# Patient Record
Sex: Male | Born: 1950 | Race: White | Hispanic: No | Marital: Married
Health system: Midwestern US, Community
[De-identification: ages and names within clinical notes are randomized; demographics above are authoritative.]

---

## 2012-01-25 IMAGING — DX CHEST PA AND LATERAL
1 series · 2 of 2 positions shown · non-contrast
Comparison: none

HISTORY: Congestion.

[Series 1: PA · U · 0.14mm/px · 2 of 2 slices shown]
[im 1/2]
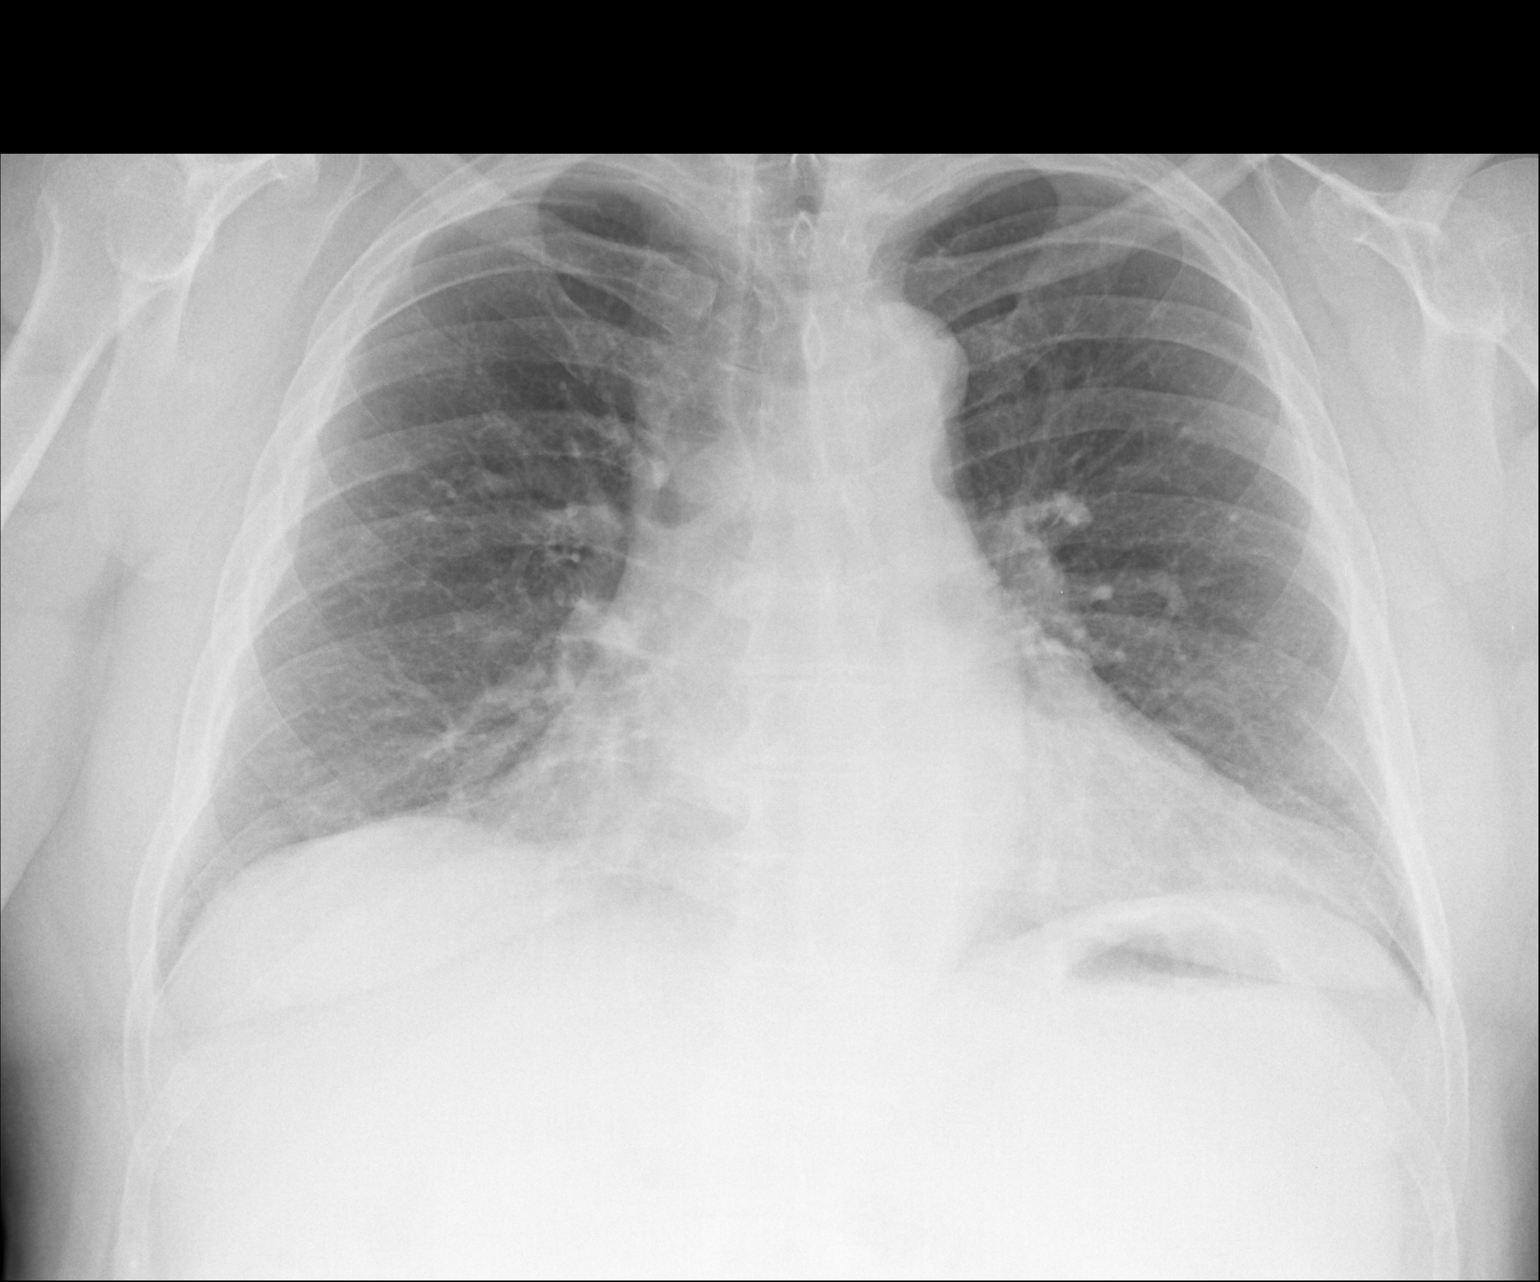
[im 2/2]
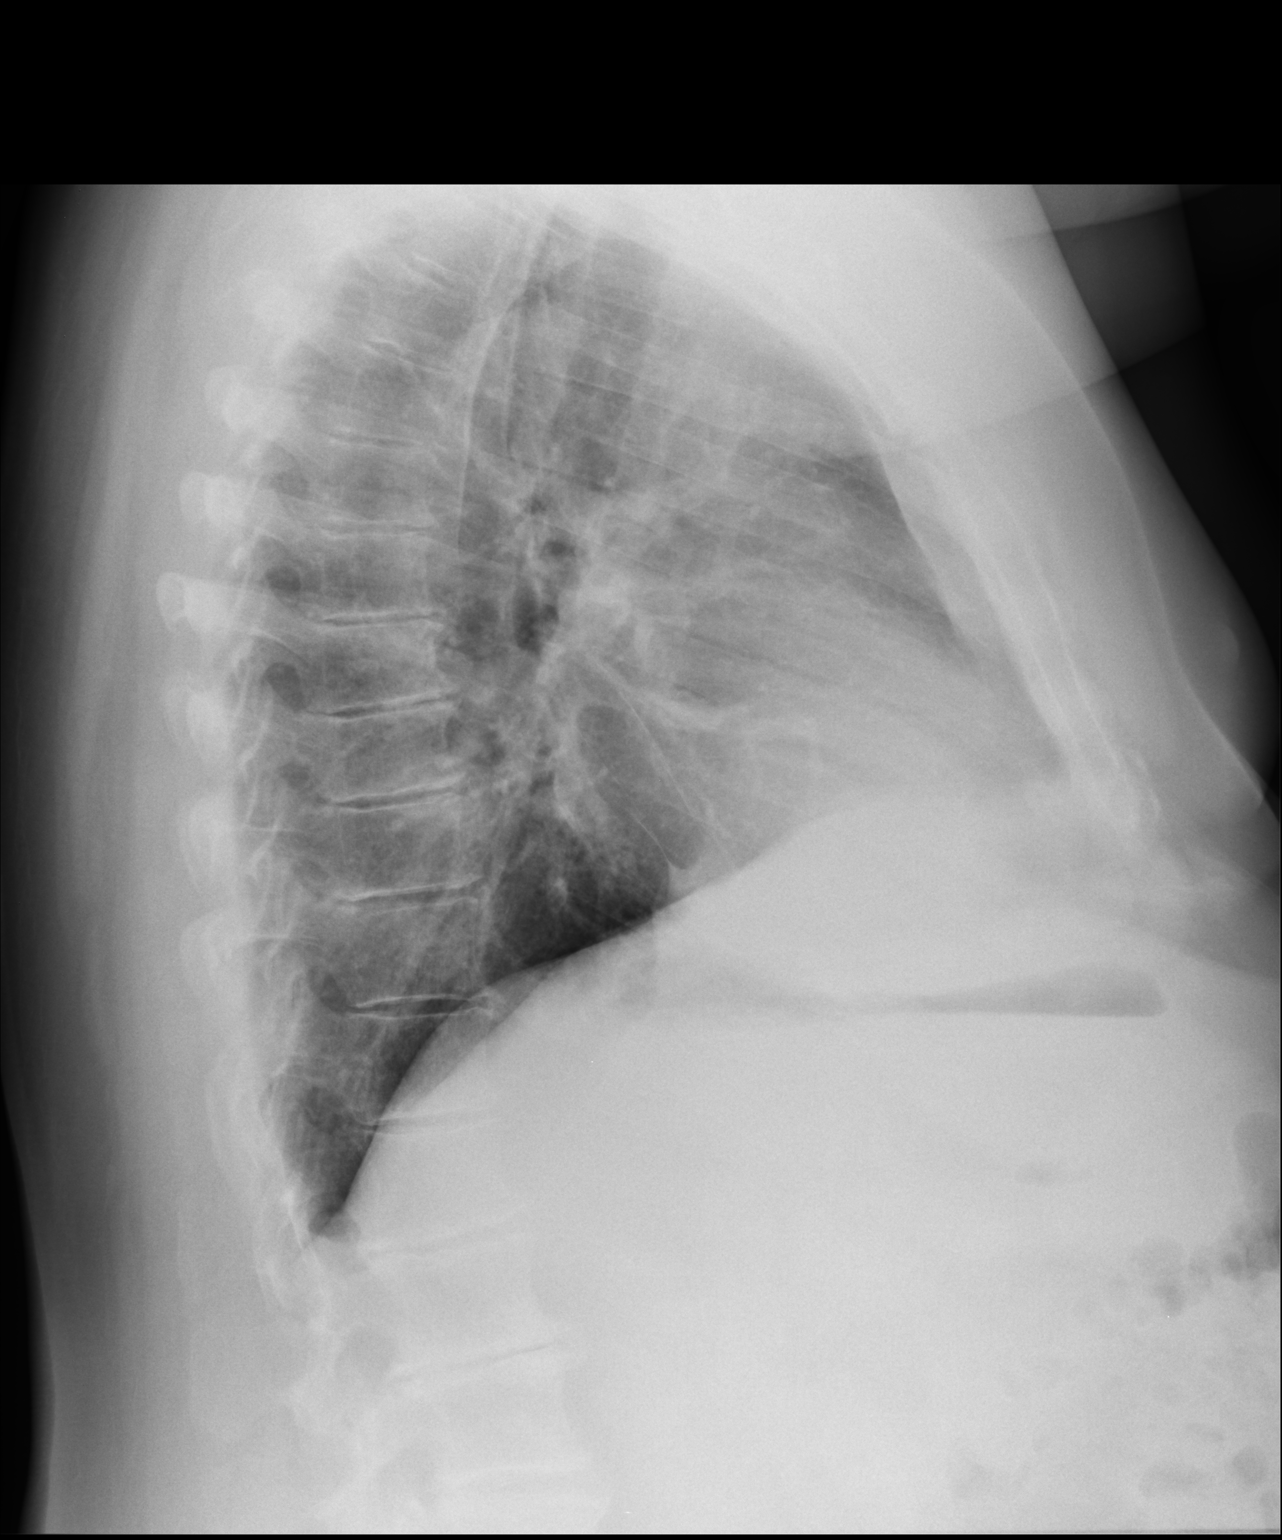

[2 of 2 positions shown; findings below may reference images not displayed]

FINDINGS: The heart is enlarged.  Lungs are moderately well expanded.
There is no evidence for airspace infiltrate or effusion.  There are
arteriosclerotic and degenerative changes.
IMPRESSION: Cardiomegaly.  No active cardiopulmonary process.

## 2014-09-02 IMAGING — DX UPPER GI WITH CONTRAST WITH KUB
1 series · 1 of 1 positions shown · IV contrast (agent unspecified)
Comparison: none

UPPER GI WITH CONTRAST WITH KUB, 09/02/2014 [DATE]:
An upper GI was performed on 09/02/2014 in this patient with morbid obesity
and
planned gastric bypass surgery who is 63 years old.
The scout film shows a normal bowel gas pattern. Degenerative change is seen
in
the spine. The patient drank the barium without difficulty. Esophageal mucosa
is
normal. There is slightly diminished motility with delayed emptying of the
esophagus and reflux from the distal esophagus proximally. The gastric and
proximal duodenal mucosa and motility appear normal.

[AP]
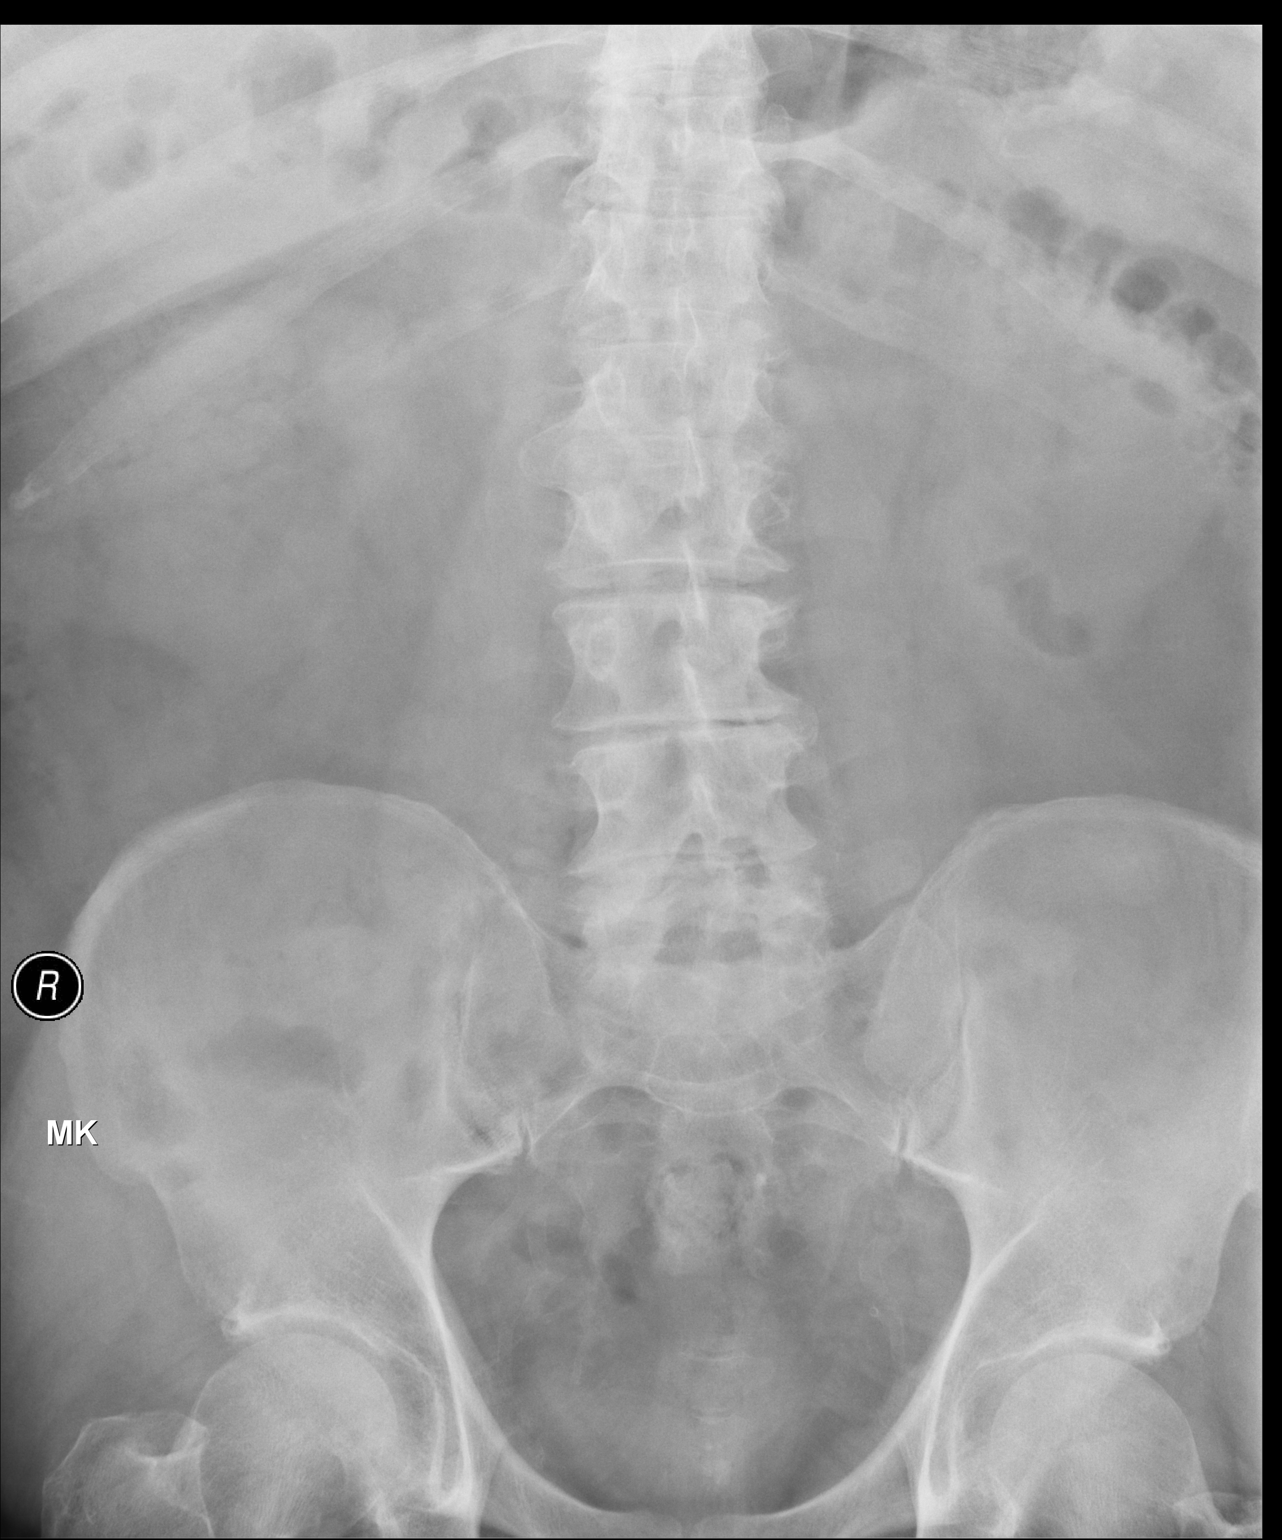

[1 of 1 positions shown; findings below may reference images not displayed]

IMPRESSION: Diminished esophageal motility. Otherwise negative upper GI.

## 2014-09-09 IMAGING — DX CHEST PA AND LATERAL
2 series · 2 of 2 positions shown · non-contrast
Comparison: 01/25/2012.

CLINICAL INDICATION:  Hypertension, preop for gastric bypass surgery

[PA]
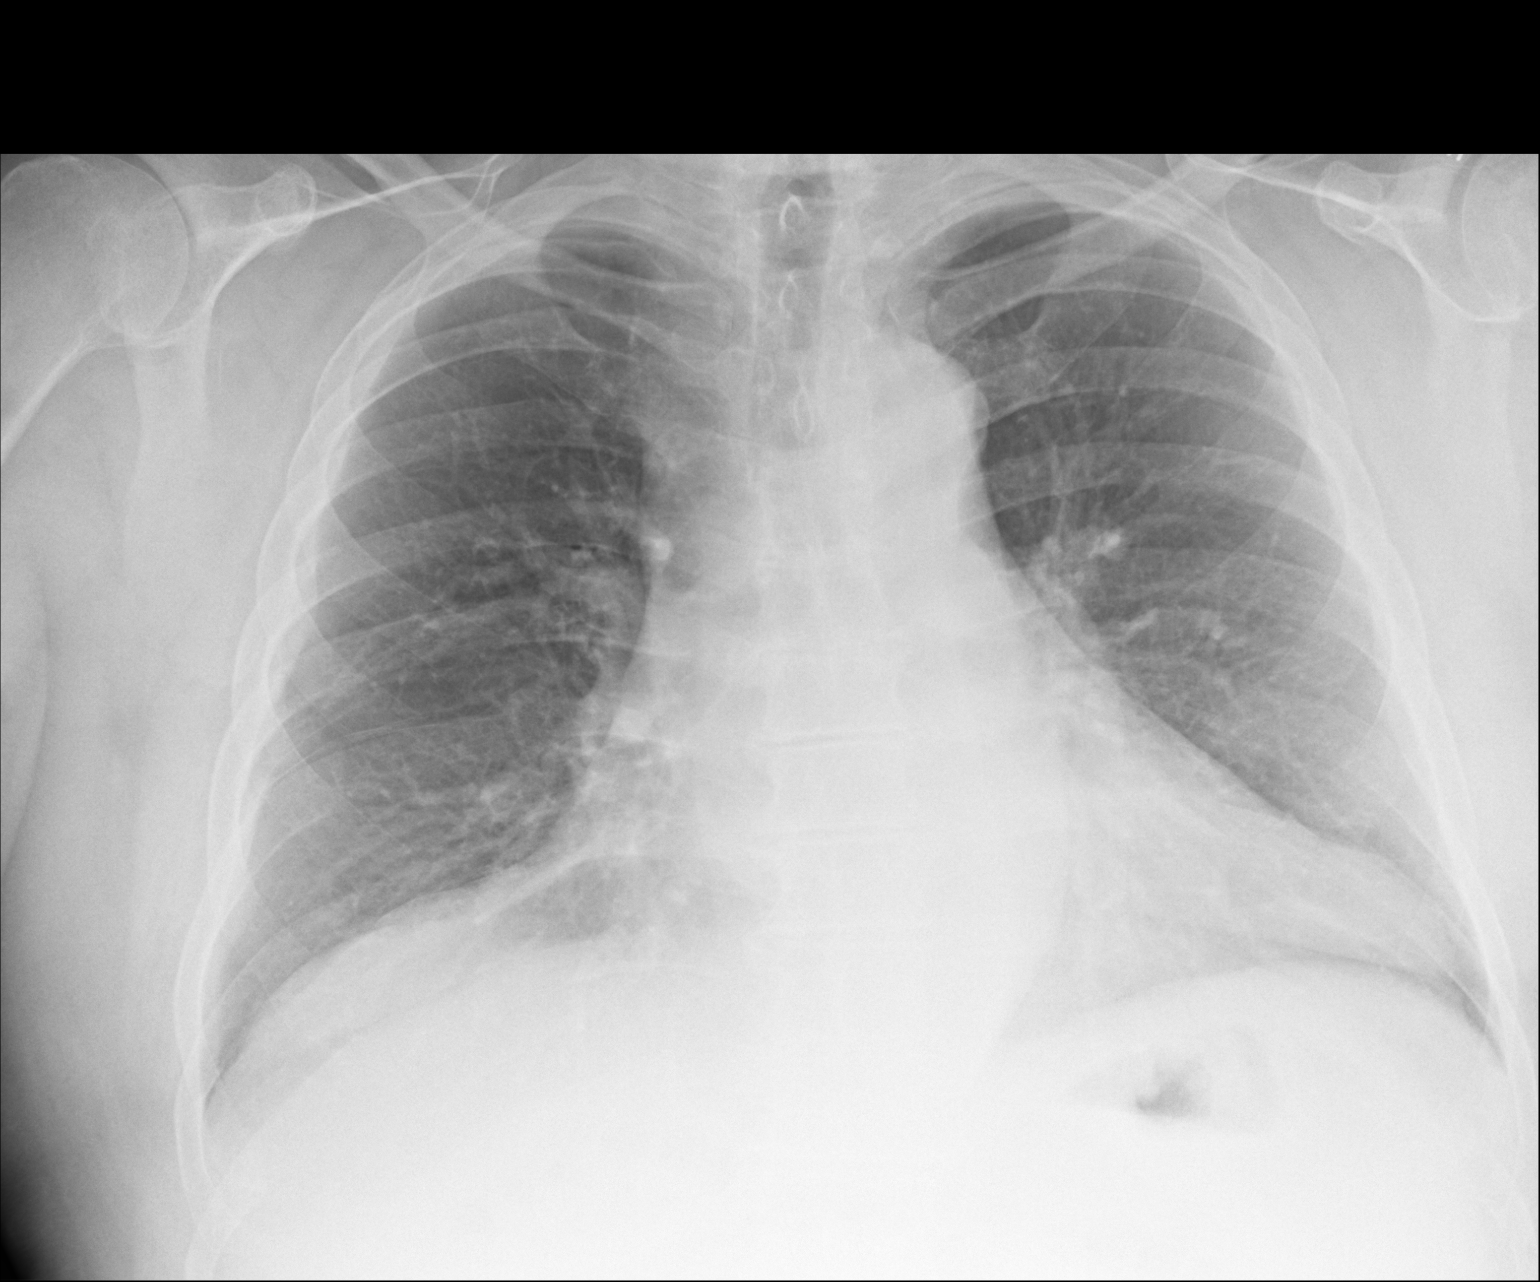

[lateral]
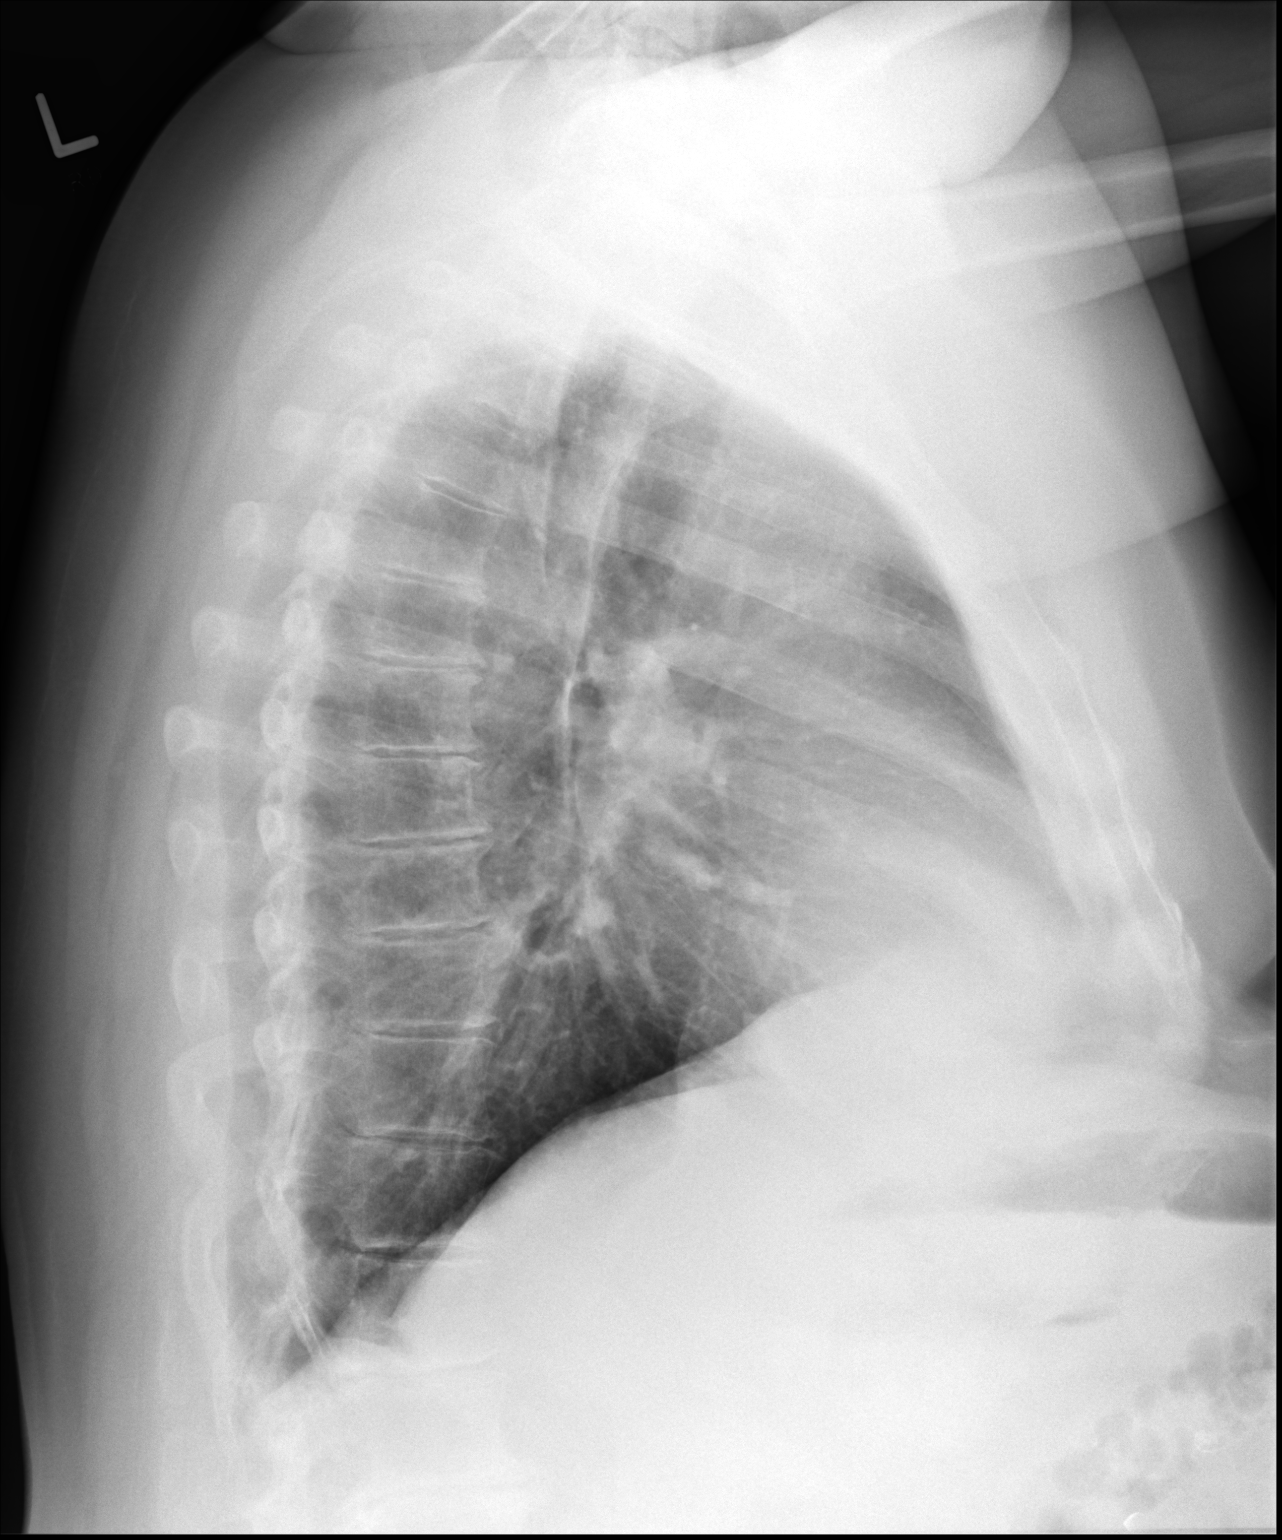

[2 of 2 positions shown; findings below may reference images not displayed]

FINDINGS: Atherosclerotic changes are seen within the thoracic aorta.
Degenerative changes are seen in the thoracic spine. No mass or consolidation.
No pleural effusions identified.
IMPRESSION: No radiographic evidence of acute cardiopulmonary disease.

## 2015-12-29 NOTE — Progress Notes (Signed)
[facility] [address] [place], [place] [address]    Acknowledgment of Informed Consent for Surgical or Medical Procedure and Sedation  I agree to allow doctor(s) STEVEN M UDELHOFEN and his/her associates or assistants, including residents and/or other qualified medical practitioner to perform the following medical treatment or procedure and to administer or direct the administration of sedation as necessary:  Procedure(s): LAPAROSCOPIC SLEEVE GASTRECTOMY WITH POSSIBLE HIATAL HERNIA REPAIR  My doctor has explained the following regarding the proposed procedure:  ??? the explanation of the procedure  ??? the benefits of the procedure  ??? the potential problems that might occur during recuperation  ??? the risks and side effects of the procedure which could include but are not limited to severe blood loss, infection, stroke or death  ??? the benefits, risks and side effect of alternative procedures including the consequences of declining this procedure or any alternative procedures  ??? the likelihood of achieving satisfactory results.  I acknowledge no guarantee or assurance has been made to me regarding the results.    I understand that during the course of this treatment/procedure, unforeseen conditions can occur which require an additional or different procedure.  I agree to allow my physician or assistants to perform such extension of the original procedure as they may find necessary.    I understand that sedation will often result in temporary impairment of memory and fine motor skills and that sedation can occasionally progress to a state of deep sedation or general anesthesia.    I understand the risks of anesthesia for surgery include, but are not limited to, sore throat, hoarseness, injury to face, mouth, or teeth; nausea; headache; injury to blood vessels or nerves; death, brain damage, or paralysis.    I understand that if I have a Limitation of Treatment order in effect during my  hospitalization, the order may or may not be in effect during this procedure.     I give my doctor permission to give me blood or blood products.  I understand that there are risks with receiving blood such as hepatitis, AIDS, fever, or allergic reaction.  I acknowledge that the risks, benefits, and alternatives of this treatment have been explained to me and that no express or implied warranty has been given by the hospital, any blood bank, or any person or entity as to the blood or blood components transfused.    At the discretion of my doctor, I agree to allow observers, equipment/product representatives and allow photographing, and/or televising of the procedure, provided my name or identity is maintained confidentially.      I agree the hospital may dispose of or use for scientific or educational purposes any tissue, fluid, or body parts which may be removed.    ________________________________Date________Time______ am/pm  (Circle One)  Patient or Signature of Closest Relative or Legal Guardian    ________________________________Date________Time______am/pm      Page 1 of ____  Witness

## 2016-01-12 ENCOUNTER — Inpatient Hospital Stay: Admit: 2016-01-12 | Attending: Surgery | Primary: Internal Medicine

## 2016-01-12 LAB — COMPREHENSIVE METABOLIC PANEL
ALT: 30 U/L (ref 10–40)
AST: 28 U/L (ref 15–37)
Albumin/Globulin Ratio: 1.1 (ref 1.1–2.2)
Albumin: 4.3 g/dL (ref 3.4–5.0)
Alkaline Phosphatase: 75 U/L (ref 40–129)
Anion Gap: 14 (ref 3–16)
BUN: 8 mg/dL (ref 7–20)
CO2: 25 mmol/L (ref 21–32)
Calcium: 9.2 mg/dL (ref 8.3–10.6)
Chloride: 98 mmol/L — ABNORMAL LOW (ref 99–110)
Creatinine: 0.6 mg/dL — ABNORMAL LOW (ref 0.8–1.3)
GFR African American: >60 (ref >60)
GFR Non-African American: >60 (ref >60)
Globulin: 3.8 g/dL
Glucose: 130 mg/dL — ABNORMAL HIGH (ref 70–99)
Potassium: 4.1 mmol/L (ref 3.5–5.1)
Sodium: 137 mmol/L (ref 136–145)
Total Bilirubin: 0.5 mg/dL (ref 0.0–1.0)
Total Protein: 8.1 g/dL (ref 6.4–8.2)

## 2016-01-12 LAB — CBC
Hematocrit: 46.9 % (ref 40.5–52.5)
Hemoglobin: 15.8 g/dL (ref 13.5–17.5)
MCH: 33.4 pg (ref 26.0–34.0)
MCHC: 33.7 g/dL (ref 31.0–36.0)
MCV: 99 fL (ref 80.0–100.0)
MPV: 8.6 fL (ref 5.0–10.5)
Platelets: 139 10*3/uL (ref 135–450)
RBC: 4.73 M/uL (ref 4.20–5.90)
RDW: 13.4 % (ref 12.4–15.4)
WBC: 6.3 10*3/uL (ref 4.0–11.0)

## 2016-01-12 LAB — TYPE AND SCREEN
ABO/Rh: A NEG
Antibody Screen: NEGATIVE

## 2016-01-12 NOTE — Patient Instructions (Signed)
[facility] PRE-SURGICAL TESTING INSTRUCTIONS                            Date of Procedure  5/15 Time of Procedure  0730    PRIOR TO PROCEDURE DATE:  1. Please follow any guidelines/instructions prior to your procedure as advised by your surgeon.    2. Arrange for someone to drive you home and be with you for the first 24 hours after discharge for your safety after your procedure for which you received sedation. Ensure it is someone we can share information with regarding your discharge.     3. You must contact your surgeon for instructions IF:  ??? You are taking any blood thinners, aspirin, anti-inflammatory or vitamin E.  ??? There is a change in your physical condition such as a cold, fever, rash, cuts, sores or any other infection, especially near your surgical site.    4. Do not drink alcohol the day before or day of your procedure.    5. A Pre-op History and Physical for surgery MUST be completed by your Physician or Urgent Care within 30 days of your procedure date.  Please bring a copy with you on the day of your procedure and along with any other testing performed.       THE DAY OF YOUR PROCEDURE:  1. Follow instructions for ARRIVAL TIME as DIRECTED BY YOUR SURGEON. If your surgeon does not give you a specific arrival time, please arrive at  0530.    2. Enter the MAIN entrance from [address] and follow the signs to the free [employer] or Valet parking (offered free of charge 6am-5pm).      3. Enter the Main Entrance of the hospital (do not enter from the lower level of the parking garage). Upon entrance, check in with the receptionist at the main desk on your left.  If no one is available at the desk, proceed into the Surgery Center Waiting Room and go through the door directly into the Surgery Center. There is a TEFL teacherCheck-in desk ACROSS from Room 5 (marked with a sign hanging from the ceiling). The phone number for the surgery center is [phone].    4. DO NOT EAT OR DRINK ANYTHING AFTER  MIDNIGHT.     5. Do not swallow water when brushing teeth. No gum, candy, mints or ice chips. Refrain from smoking or at least decrease the amount.    6. MEDICATIONS   ??? Take the following medications with a SMALL sip of water:  TAKE AMLODIPINE AND METOPROLOL   ??? Use your usual dose of inhalers the morning of surgery. BRING your rescue inhaler with you to hospital.   ??? Anesthesia does NOT want you to take insulin the morning of surgery. They will control your blood sugar while you are at the hospital. Please contact your ordering physician for instructions regarding your insulin the night before your procedure. If you have an insulin pump, please keep it set on basal rate.     7. Dress in loose, comfortable clothing appropriate for redressing after your procedure. Do not wear jewelry (including body piercings), make-up (especially NO eye make-up), fingernail polish (NO toenail polish if foot/leg surgery), lotion, powders or metal hairclips.    8. Dentures, glasses, or contacts will need to be removed before your procedure. Bring cases for your glasses, contacts, dentures, or hearing aids to protect them while you are in surgery.  9. If you use a CPAP, please bring it with you on the day of your procedure.    10. We recommend that valuable personal  belongings, such as credit cards, cash, cell phones, e-tablets or jewelry, be left at home during your stay. The hospital will not be responsible for valuables that are not secured in the hospital safe. However, if your insurance requires a co-pay, you may want to bring a method of payment, i.e. check, cash, or credit card, if you wish to pay your co-pay the day of surgery.      11. If you are to stay overnight, you may bring a bag with personal items. Please have any large items you may need brought in by your family after your arrival to your hospital room.    12. If you have a Living Will or Durable Power of Attorney, please bring a copy on the day of your  procedure.     68.  With your permission, one family member may accompany you while you are being prepared for surgery. Once you are ready, additional family members may join you.    HOW WE KEEP YOU SAFE and WORK TO PREVENT SURGICAL SITE INFECTIONS:  1. Health care workers should always check your ID bracelet to verify your name and birth date. You will be asked many times to state your name, date of birth, and allergies.    2. Health care workers should always clean their hands with soap or alcohol gel before providing care to you. It is okay to ask anyone if they cleaned their hands before they touch you.    3. You will be actively involved in verifying the type of procedure you are having and ensuring the correct surgical site. This will be confirmed multiple times prior to your procedure. Do NOT mark your surgery site UNLESS instructed to by your surgeon.     4. Do not shave or wax for 72 hours prior to procedure near your operative site. Shaving with a razor can irritate your skin and make it easier to develop an infection. On the day of your procedure, any hair that needs to be removed near the surgical site will be ???clipped??? by a Dietitian using a special clippers designed to avoid skin irritation.    5. When you are in the operating room, your surgical site will be cleansed with a special soap, and in most cases, you will be given an antibiotic before the surgery begins.      AFTER YOUR PROCEDURE:  1. For comfort and safety, arrange to have someone at home with you for the first 24 hours after discharge.    2. You and your family will be given written instructions about your diet, activity, dressing care, medications, and return visits.     3. Always clean your hands before and after caring for your wound. Do not let your family touch your surgery site without cleaning their hands.   4. Mild nausea, headache, muscle aches, sore throat, or fatigue may occur after anesthesia. Should any of these  symptoms become severe, or should you notice any signs of infection, you should call your surgeon.    5. Narcotic pain medications can cause significant constipation.  You may want to add a stool softener to your postoperative medication schedule or speak to your surgeon on how best to manage this SIDE EFFECT.    6. The hospital offers ???Meds to Beds???, which is a program for your convenience  to have your prescriptions filled prior to discharge.  You will be required to pay your prescription co-pay upon delivery/pick-up of medication. Please discuss with your nurse if interested in using this service.     SPECIAL INSTRUCTIONS     Thank you for allowing [place] to care for you.  We strive to exceed your expectations in the overall delivery of care and service provided to you and your family.     If you need to contact us for any reason, please call us at [phone]    Instructions reviewed and copy given to patient during preadmission testing visit.  Kerry-Anne Mezo.01/12/2016 .8:18 AM      ADDITIONAL EDUCATIONAL INFORMATION REVIEWED / PROVIDED TO YOU AND YOUR FAMILY:  Yes Taking Control of Your Pain   Yes FAQs about ???Surgical Site Infections???      No Hibiclens?? Bathing Instructions   Yes Incentive Spirometer given to patient- PLEASE BRING THIS SPIROMETER BACK WITH YOU ON THE DAY OF YOUR SURGERY  No CMS Comprehensive Care for Joint Replacement Model Notification Letter  No Other

## 2016-01-12 NOTE — Progress Notes (Signed)
The following educational items and goals will be achieved upon completion of the patient's Pre-admission testing appointment:             Identify the learner who is being assessed for education:  Patient                       Ability to Learn:  Exhibits ability to grasp concepts and respond to questions: High  Ready to Learn: Yes  calm   Preferred Method of Learning:  verbal  Barriers to Learning: Verbalizes interest  Special Considerations due to cultural, religious, spiritual beliefs:  No  Language:  English  Language Interpreter:  No    Ada  '[x]'$  Appropriate evaluation / integration of data as delineated by ASPAN Standards of Perianesthesia Nursing Practice    Pain scale and pain management   '[x]'$ Patient verbalizes understanding of pain scale and pain management  '[x]'$ Pre-operative determination of patient???s anticipated Post-Operative pain goal:   4 of 10 on 10 point scale post op goal  '[]'$  Other     Medication(s) - Compliance with preop medication instructions  '[x]'$  Patient verbalizes understanding of preop medications (see [facility] Presurgical Instructions)    Instructions, Pre op                                                                                            '[x]'$  Patient verbalizes understanding of presurgical instructions as reviewed with phone interview nurse or in-person nurse review    Fall Risk Potential, Preoperatively                                                                                   '[]'$ No preoperative risk identified  '[x]'$ Preop risk identified:                    '[]'$ Sensory deficit        '[]'$ Motor deficit        '[]'$ Balance problem        '[x]'$ Home medication        '[]'$ Uses assistive device                    '[]'$ History of a Fall within the last 30 days    Goal(s) for fall prevention:  '[x]'$ Prevent fall or injury by requesting assistance with activities of daily living  '[x]'$ Patient / Significant other verbalizes understanding the need to call for  assistance prior to getting out of bed during hospitalization      Infection Precautions                                                                                            [  x] Patient understands implementation of Surgical Site Infection precautions (see [facility]ructions)     Patient Safety  [x]  Patient identification verified  [x]  Site verified    Instructions - Discharge Planning for Outpatients  []  Patient / significant other voices understanding of home care and follow up procedures  []  Encourage patient / significant other to review discharge instructions the day after procedure due to sedation on day of surgery    Anticipated Special Needs upon discharge:        []  Cooling device        []  Crutches       []  Walker        []  Wound Support device        []  Drain        []  Other       Instructions - Discharge Planning for Admitted patients  [x]  Patient / significant other understands plan for admission after surgery  [x]  Patient / significant other understands plan for anticipated discharge dispostion        01/12/2016 2:43 PM November Sypher

## 2016-01-24 ENCOUNTER — Inpatient Hospital Stay
Admit: 2016-01-24 | Discharge: 2016-01-25 | Disposition: A | Discharge status: Home / Self Care | Source: Ambulatory Visit | Attending: Surgery | Admitting: Surgery

## 2016-01-24 LAB — POCT GLUCOSE: POC Glucose: 121 mg/dl — ABNORMAL HIGH (ref 70–99)

## 2016-01-24 LAB — TYPE AND SCREEN
ABO/Rh: A NEG
Antibody Screen: NEGATIVE

## 2016-01-24 MED ORDER — LACTATED RINGERS IV SOLN
INTRAVENOUS | Status: DC
Start: 2016-01-24 — End: 2016-01-24
  Administered 2016-01-24: 11:00:00 via INTRAVENOUS

## 2016-01-24 MED ORDER — ONDANSETRON HCL 4 MG/2ML IJ SOLN
4 MG/2ML | Freq: Once | INTRAMUSCULAR | Status: AC
Start: 2016-01-24 — End: 2016-01-24
  Administered 2016-01-24: 11:00:00 4 mg via INTRAVENOUS

## 2016-01-24 MED ORDER — ENOXAPARIN SODIUM 30 MG/0.3ML SC SOLN
30 MG/0.3ML | Freq: Two times a day (BID) | SUBCUTANEOUS | Status: DC
Start: 2016-01-24 — End: 2016-01-25
  Administered 2016-01-25 (×2): 30 mg via SUBCUTANEOUS

## 2016-01-24 MED ORDER — KETAMINE HCL 50 MG/ML IJ SOLN
50 | INTRAMUSCULAR | Status: AC
Start: 2016-01-24 — End: 2016-01-24

## 2016-01-24 MED ORDER — MIDAZOLAM HCL 2 MG/2ML IJ SOLN
2 | INTRAMUSCULAR | Status: AC
Start: 2016-01-24 — End: 2016-01-24

## 2016-01-24 MED ORDER — METOPROLOL SUCCINATE ER 50 MG PO TB24
50 MG | Freq: Every day | ORAL | Status: DC
Start: 2016-01-24 — End: 2016-01-25
  Administered 2016-01-25: 12:00:00 50 mg via ORAL

## 2016-01-24 MED ORDER — SUCCINYLCHOLINE CHLORIDE 20 MG/ML IJ SOLN
20 | INTRAMUSCULAR | Status: AC
Start: 2016-01-24 — End: 2016-01-24

## 2016-01-24 MED ORDER — ENOXAPARIN SODIUM 40 MG/0.4ML SC SOLN
40 | Freq: Every day | SUBCUTANEOUS | Status: DC
Start: 2016-01-24 — End: 2016-01-24

## 2016-01-24 MED ORDER — NORMAL SALINE FLUSH 0.9 % IV SOLN
0.9 | INTRAVENOUS | Status: DC | PRN
Start: 2016-01-24 — End: 2016-01-24

## 2016-01-24 MED ORDER — PROPOFOL 200 MG/20ML IV EMUL
200 | INTRAVENOUS | Status: AC
Start: 2016-01-24 — End: 2016-01-24

## 2016-01-24 MED ORDER — NEOSTIGMINE METHYLSULFATE 1 MG/ML IJ SOLN
1 | INTRAMUSCULAR | Status: AC
Start: 2016-01-24 — End: 2016-01-24

## 2016-01-24 MED ORDER — FAMOTIDINE 20 MG/2ML IV SOLN
20 MG/2ML | Freq: Once | INTRAVENOUS | Status: AC
Start: 2016-01-24 — End: 2016-01-24
  Administered 2016-01-24: 11:00:00 20 mg via INTRAVENOUS

## 2016-01-24 MED ORDER — APREPITANT 40 MG PO CAPS
40 MG | Freq: Once | ORAL | Status: AC
Start: 2016-01-24 — End: 2016-01-24
  Administered 2016-01-24: 11:00:00 40 mg via ORAL

## 2016-01-24 MED ORDER — LACTATED RINGERS IV SOLN
INTRAVENOUS | Status: DC
Start: 2016-01-24 — End: 2016-01-24
  Administered 2016-01-24: 10:00:00 via INTRAVENOUS

## 2016-01-24 MED ORDER — FENTANYL CITRATE (PF) 100 MCG/2ML IJ SOLN
100 | INTRAMUSCULAR | Status: AC | PRN
Start: 2016-01-24 — End: 2016-01-24
  Administered 2016-01-24 (×4): 25 ug via INTRAVENOUS

## 2016-01-24 MED ORDER — AMLODIPINE BESYLATE 10 MG PO TABS
10 MG | Freq: Every day | ORAL | Status: DC
Start: 2016-01-24 — End: 2016-01-25
  Administered 2016-01-25: 12:00:00 10 mg via ORAL

## 2016-01-24 MED ORDER — NORMAL SALINE FLUSH 0.9 % IV SOLN
0.9 | INTRAVENOUS | Status: DC | PRN
Start: 2016-01-24 — End: 2016-01-25

## 2016-01-24 MED ORDER — FENTANYL CITRATE (PF) 250 MCG/5ML IJ SOLN
250 | INTRAMUSCULAR | Status: AC
Start: 2016-01-24 — End: 2016-01-24

## 2016-01-24 MED ORDER — HEPARIN SODIUM (PORCINE) 5000 UNIT/ML IJ SOLN
5000 UNIT/ML | INTRAMUSCULAR | Status: AC
Start: 2016-01-24 — End: 2016-01-24
  Administered 2016-01-24: 11:00:00 5000 [IU] via SUBCUTANEOUS

## 2016-01-24 MED ORDER — OXYCODONE HCL 5 MG/5ML PO SOLN
5 MG/ML | ORAL | Status: DC | PRN
Start: 2016-01-24 — End: 2016-01-25
  Administered 2016-01-25 (×2): 10 mg via ORAL

## 2016-01-24 MED ORDER — PANTOPRAZOLE SODIUM 40 MG PO TBEC
40 MG | Freq: Every day | ORAL | Status: DC
Start: 2016-01-24 — End: 2016-01-25
  Administered 2016-01-25: 10:00:00 40 mg via ORAL

## 2016-01-24 MED ORDER — LACTATED RINGERS IV SOLN
INTRAVENOUS | Status: DC
Start: 2016-01-24 — End: 2016-01-25
  Administered 2016-01-24: 15:00:00 via INTRAVENOUS

## 2016-01-24 MED ORDER — KETOROLAC TROMETHAMINE 30 MG/ML IJ SOLN
30 | INTRAMUSCULAR | Status: AC
Start: 2016-01-24 — End: 2016-01-24

## 2016-01-24 MED ORDER — ACETAMINOPHEN 10 MG/ML IV SOLN
10 MG/ML | Freq: Once | INTRAVENOUS | Status: AC
Start: 2016-01-24 — End: 2016-01-24
  Administered 2016-01-24: 12:00:00 1000 mg via INTRAVENOUS

## 2016-01-24 MED ORDER — MORPHINE SULFATE (PF) 4 MG/ML IV SOLN
4 | INTRAVENOUS | Status: DC | PRN
Start: 2016-01-24 — End: 2016-01-25

## 2016-01-24 MED ORDER — OXYCODONE HCL 5 MG/5ML PO SOLN
5 MG/ML | ORAL | Status: DC | PRN
Start: 2016-01-24 — End: 2016-01-25

## 2016-01-24 MED ORDER — PROMETHAZINE HCL 25 MG/ML IJ SOLN
25 MG/ML | Freq: Four times a day (QID) | INTRAMUSCULAR | Status: DC | PRN
Start: 2016-01-24 — End: 2016-01-25

## 2016-01-24 MED ORDER — EPHEDRINE SULFATE 50 MG/ML IJ SOLN
50 | INTRAMUSCULAR | Status: AC
Start: 2016-01-24 — End: 2016-01-24

## 2016-01-24 MED ORDER — METOCLOPRAMIDE HCL 5 MG/ML IJ SOLN
5 MG/ML | Freq: Once | INTRAMUSCULAR | Status: AC
Start: 2016-01-24 — End: 2016-01-24
  Administered 2016-01-24: 11:00:00 10 mg via INTRAVENOUS

## 2016-01-24 MED ORDER — CHLORHEXIDINE GLUCONATE 0.12 % MT SOLN
0.12 % | Freq: Two times a day (BID) | OROMUCOSAL | Status: DC
Start: 2016-01-24 — End: 2016-01-24
  Administered 2016-01-24: 11:00:00 15 mL via OROMUCOSAL

## 2016-01-24 MED ORDER — MORPHINE SULFATE (PF) 2 MG/ML IV SOLN
2 | INTRAVENOUS | Status: DC | PRN
Start: 2016-01-24 — End: 2016-01-25
  Administered 2016-01-24: 19:00:00 2 mg via INTRAVENOUS

## 2016-01-24 MED ORDER — BUPIVACAINE-EPINEPHRINE (PF) 0.5% -1:200000 IJ SOLN
INTRAMUSCULAR | Status: AC
Start: 2016-01-24 — End: 2016-01-24

## 2016-01-24 MED ORDER — NORMAL SALINE FLUSH 0.9 % IV SOLN
0.9 | Freq: Two times a day (BID) | INTRAVENOUS | Status: DC
Start: 2016-01-24 — End: 2016-01-24

## 2016-01-24 MED ORDER — NORMAL SALINE FLUSH 0.9 % IV SOLN
0.9 | Freq: Two times a day (BID) | INTRAVENOUS | Status: DC
Start: 2016-01-24 — End: 2016-01-25
  Administered 2016-01-25: 12:00:00 10 mL via INTRAVENOUS

## 2016-01-24 MED ORDER — DEXAMETHASONE SODIUM PHOSPHATE 20 MG/5ML IJ SOLN
20 MG/5ML | Freq: Once | INTRAMUSCULAR | Status: AC
Start: 2016-01-24 — End: 2016-01-24
  Administered 2016-01-24: 11:00:00 10 mg via INTRAVENOUS

## 2016-01-24 MED ORDER — PHENYLEPHRINE HCL 10 MG/ML IJ SOLN
10 | INTRAMUSCULAR | Status: AC
Start: 2016-01-24 — End: 2016-01-24

## 2016-01-24 MED ORDER — SUGAMMADEX SODIUM 200 MG/2ML IV SOLN
200 | INTRAVENOUS | Status: AC
Start: 2016-01-24 — End: 2016-01-24

## 2016-01-24 MED ORDER — LIDOCAINE HCL (CARDIAC) 20 MG/ML IV SOLN
20 | INTRAVENOUS | Status: AC
Start: 2016-01-24 — End: 2016-01-24

## 2016-01-24 MED ORDER — HYDROMORPHONE HCL 2 MG/ML IJ SOLN
2 | INTRAMUSCULAR | Status: AC
Start: 2016-01-24 — End: 2016-01-24

## 2016-01-24 MED ORDER — DEXTROSE 5 % IV SOLN (MINI-BAG)
5 % | INTRAVENOUS | Status: AC
Start: 2016-01-24 — End: 2016-01-24
  Administered 2016-01-24: 12:00:00 2 g via INTRAVENOUS

## 2016-01-24 MED ORDER — OXYCODONE HCL 5 MG/5ML PO SOLN
5 MG/ML | ORAL | Status: DC | PRN
Start: 2016-01-24 — End: 2016-01-24

## 2016-01-24 MED ORDER — ROCURONIUM BROMIDE 50 MG/5ML IV SOLN
50 | INTRAVENOUS | Status: AC
Start: 2016-01-24 — End: 2016-01-24

## 2016-01-24 MED ORDER — ONDANSETRON HCL 4 MG/2ML IJ SOLN
4 | Freq: Four times a day (QID) | INTRAMUSCULAR | Status: DC | PRN
Start: 2016-01-24 — End: 2016-01-25

## 2016-01-24 MED ORDER — LACTATED RINGERS IV SOLN
INTRAVENOUS | Status: DC
Start: 2016-01-24 — End: 2016-01-24

## 2016-01-24 MED ORDER — HYDROMORPHONE HCL 1 MG/ML IJ SOLN
1 | INTRAMUSCULAR | Status: DC | PRN
Start: 2016-01-24 — End: 2016-01-24
  Administered 2016-01-24 (×2): 0.5 mg via INTRAVENOUS

## 2016-01-24 MED FILL — EMEND 40 MG PO CAPS: 40 MG | ORAL | Qty: 1

## 2016-01-24 MED FILL — KETAMINE HCL 50 MG/ML IJ SOLN: 50 MG/ML | INTRAMUSCULAR | Qty: 10

## 2016-01-24 MED FILL — CHLORHEXIDINE GLUCONATE 0.12 % MT SOLN: 0.12 % | OROMUCOSAL | Qty: 15

## 2016-01-24 MED FILL — MORPHINE SULFATE (PF) 2 MG/ML IV SOLN: 2 mg/mL | INTRAVENOUS | Qty: 1

## 2016-01-24 MED FILL — KETOROLAC TROMETHAMINE 30 MG/ML IJ SOLN: 30 MG/ML | INTRAMUSCULAR | Qty: 1

## 2016-01-24 MED FILL — FENTANYL CITRATE (PF) 100 MCG/2ML IJ SOLN: 100 MCG/2ML | INTRAMUSCULAR | Qty: 2

## 2016-01-24 MED FILL — ONDANSETRON HCL 4 MG/2ML IJ SOLN: 4 MG/2ML | INTRAMUSCULAR | Qty: 2

## 2016-01-24 MED FILL — FAMOTIDINE 20 MG/2ML IV SOLN: 20 MG/2ML | INTRAVENOUS | Qty: 2

## 2016-01-24 MED FILL — FENTANYL CITRATE (PF) 250 MCG/5ML IJ SOLN: 250 MCG/5ML | INTRAMUSCULAR | Qty: 5

## 2016-01-24 MED FILL — LIDOCAINE HCL (CARDIAC) 20 MG/ML IV SOLN: 20 MG/ML | INTRAVENOUS | Qty: 5

## 2016-01-24 MED FILL — ZEMURON 50 MG/5ML IV SOLN: 50 MG/5ML | INTRAVENOUS | Qty: 5

## 2016-01-24 MED FILL — LACTATED RINGERS IV SOLN: INTRAVENOUS | Qty: 1000

## 2016-01-24 MED FILL — MIDAZOLAM HCL 2 MG/2ML IJ SOLN: 2 MG/ML | INTRAMUSCULAR | Qty: 2

## 2016-01-24 MED FILL — SENSORCAINE-MPF/EPINEPHRINE 0.5% -1:200000 IJ SOLN: INTRAMUSCULAR | Qty: 30

## 2016-01-24 MED FILL — BRIDION 200 MG/2ML IV SOLN: 200 MG/2ML | INTRAVENOUS | Qty: 2

## 2016-01-24 MED FILL — PHENYLEPHRINE HCL 10 MG/ML IJ SOLN: 10 MG/ML | INTRAMUSCULAR | Qty: 1

## 2016-01-24 MED FILL — METOCLOPRAMIDE HCL 5 MG/ML IJ SOLN: 5 MG/ML | INTRAMUSCULAR | Qty: 2

## 2016-01-24 MED FILL — PROPOFOL 200 MG/20ML IV EMUL: 200 MG/20ML | INTRAVENOUS | Qty: 20

## 2016-01-24 MED FILL — HYDROMORPHONE HCL 1 MG/ML IJ SOLN: 1 MG/ML | INTRAMUSCULAR | Qty: 1

## 2016-01-24 MED FILL — EPHEDRINE SULFATE 50 MG/ML IJ SOLN: 50 MG/ML | INTRAMUSCULAR | Qty: 1

## 2016-01-24 MED FILL — DEXAMETHASONE SODIUM PHOSPHATE 20 MG/5ML IJ SOLN: 20 MG/5ML | INTRAMUSCULAR | Qty: 5

## 2016-01-24 MED FILL — OFIRMEV 10 MG/ML IV SOLN: 10 MG/ML | INTRAVENOUS | Qty: 100

## 2016-01-24 MED FILL — HYDROMORPHONE HCL 2 MG/ML IJ SOLN: 2 MG/ML | INTRAMUSCULAR | Qty: 1

## 2016-01-24 MED FILL — ANECTINE 20 MG/ML IJ SOLN: 20 MG/ML | INTRAMUSCULAR | Qty: 10

## 2016-01-24 MED FILL — HEPARIN SODIUM (PORCINE) 5000 UNIT/ML IJ SOLN: 5000 UNIT/ML | INTRAMUSCULAR | Qty: 1

## 2016-01-24 MED FILL — CEFTRIAXONE SODIUM 2 G IJ SOLR: 2 g | INTRAMUSCULAR | Qty: 2

## 2016-01-24 MED FILL — NEOSTIGMINE METHYLSULFATE 1 MG/ML IJ SOLN: 1 MG/ML | INTRAMUSCULAR | Qty: 10

## 2016-01-24 NOTE — H&P (Signed)
Nicholas Wagner    [id number]    [facility] Same Day Surgery Update H & P  Department of General Surgery   Surgical Service   CNP Pre-operative History and Physical  Last H & P within the last 30 days.    DIAGNOSIS:   MORBID OBESITY E66.1    PROCEDURE:  Laparoscopic Sleeve Gastrectomy With Possible Hiatal Hernia Repair      HISTORY OF PRESENT ILLNESS: Pt. Is a 65 y.o. Morbidly Obese Male who has been unsuccessful at losing weight using conservative treatment. Pt. Is now here for surgical intervention.    Please see initial H & P     Past Medical History:        Diagnosis Date   ??? Arthritis    ??? Hernia    ??? Hx of blood clots 2912    after shoulder surgery   ??? Hypertension    ??? Obesity      Past Surgical History:        Procedure Laterality Date   ??? BACK SURGERY  1983    fusion   ??? CARDIAC CATHETERIZATION  2012   ??? CATARACT REMOVAL WITH IMPLANT Bilateral    ??? HERNIA REPAIR  2009   ??? KNEE SURGERY Bilateral     injections and drainings   ??? ROTATOR CUFF REPAIR Left    ??? UPPER GASTROINTESTINAL ENDOSCOPY       Past Social History:  Social History     Social History   ??? Marital status: Married     Spouse name: N/A   ??? Number of children: N/A   ??? Years of education: N/A     Social History Main Topics   ??? Smoking status: Never Smoker   ??? Smokeless tobacco: Not on file   ??? Alcohol use Yes      Comment: OCC   ??? Drug use: No   ??? Sexual activity: Not on file     Other Topics Concern   ??? Not on file     Social History Narrative   ??? No narrative on file         Medications Prior to Admission:      Prior to Admission medications    Medication Sig Start Date End Date Taking? Authorizing Provider   amLODIPine (NORVASC) 10 MG tablet Take 10 mg by mouth daily   Yes Historical Provider, MD   clopidogrel (PLAVIX) 75 MG tablet Take 75 mg by mouth daily   Yes Historical Provider, MD   metoprolol succinate (TOPROL XL) 50 MG extended release tablet Take 50 mg by mouth daily   Yes Historical Provider, MD   sucralfate (CARAFATE) 1 GM tablet  Take 1 g by mouth 4 times daily   Yes Historical Provider, MD   omeprazole (PRILOSEC) 20 MG delayed release capsule Take 40 mg by mouth daily   Yes Historical Provider, MD         Allergies:  Review of patient's allergies indicates no known allergies.    PHYSICAL EXAM:      BP 128/78   Pulse 78   Temp 97.9 ??F (36.6 ??C) (Oral)    Resp 18   Ht 5\' 7"  (1.702 m)   Wt 228 lb 0.6 oz (103.4 kg)   SpO2 94%   BMI 35.72 kg/m2     Heart:  regular rate and rhythm,no murmur     Lungs:  No increased work of breathing, good air exchange, clear to auscultation bilaterally, no crackles or  wheezing    Abdomen:  soft, non-distended, non-tender, no rebound tenderness or guarding, normal active bowel sounds and no masses palpated    ASSESSMENT AND PLAN:    1.  Patient seen and focused exam done today- no new changes since last physical exam on 01/11/16    2.  Access to ancillary services are available per request of the provider.    Marijean Heath, CNP     01/24/2016

## 2016-01-24 NOTE — Progress Notes (Signed)
Bariatric Surgery           Post-Op Note    CC: Morbid Obesity     Subjective:  Patient reports that he is doing well. He states that his pain is well controlled.  He denies nausea or vomiting.  He has been able to ambulate in the halls and has voided without difficulty.  IS 2000    Objective:  Vitals:    01/24/16 1130 01/24/16 1145 01/24/16 1246 01/24/16 1313   BP: 117/65 109/67 123/80    Pulse: 88 89 83    Resp: 20 20 18 18    Temp:  97.6 ??F (36.4 ??C) 97.9 ??F (36.6 ??C)    TempSrc:  Temporal Oral    SpO2: 91% 97% 96% 93%   Weight:       Height:           Intake/Output Summary (Last 24 hours) at 01/24/16 1513  Last data filed at 01/24/16 1156   Gross per 24 hour   Intake             2342 ml   Output               30 ml   Net             2312 ml       IntraOp Crystalloid 2100    EBL 30    U/O 0        PostOp U/O  1x      Continuous Infusions:     ??? lactated ringers 125 mL/hr at 01/24/16 1119         Physical Exam:      Vitals:    01/24/16 1313   BP:    Pulse:    Resp: 18   Temp:    SpO2: 93%       CONSTITUTIONAL:  awake, alert, cooperative, no apparent distress  LUNGS:  CTAB, no R/R/W  CV: RRR, no m/r/g  ABDOMEN:  Soft, obese, appropriately tender, incisions c/d/i and well approximated without surrounding erythema or ecchymosis     Assessment and Plan:  Nicholas Wagner is a 65 y.o. male patient s/p Sleeve gastrectomy, EGD, ventral hernia repair POD #0    1. Diet: Bariatric clear liquid diet   2. Fluids: LR @ 125  3. Pain management: Roxicodone and Morphine   4. Voiding without difficulty    5. Encourage ambulation and incentive spirometry use  6. Prophylaxis: SCDs, Lovenox 30 MG BID   7. Stable post-op check    Nicholas Russianourtney R Kattaleya Alia, MD  01/24/2016, 3:13 PM  Pager: [phone]

## 2016-01-24 NOTE — Care Coordination-Inpatient (Signed)
Pt will need two weeks of Lovenox upon discharge. Once a script is written we can send it down for Meds-beds to check coverage.     Electronically signed by Archie PattenShannon Dicie Edelen, RN on 01/24/2016 at 3:34 PM

## 2016-01-24 NOTE — Op Note (Signed)
Facility: [facility]   PREOPERATIVE DIAGNOSIS:  Morbid obesity  POSTOPERATIVE DIAGNOSIS:  Morbid obesity   BMI:   36.97  PROCEDURE PERFORMED: Sleeve gastrectomy   (laparoscopic) 40 French ,  upper endoscopy and ventral hernia repair         Assistant: Louis Matherne, MD Resident  Anesthesia: GETA  Estimated Blood Loss: 10 cc's  Complications: None      Indications for procedure:  This is an obese patient who came to see me in the office in consultation for weight loss surgery.  This patient is a good candidate for the procedure and has been thoroughly educated on the risks and benefits of the procedure including the possibility of bleeding, infection, and even death.  The patient has been extensively educated on post operative dietary requirements and the lifestyle changes needed for a successful outcome.  The patient has made a commitment to make the necessary dietary and lifestyle changes for a successful outcome.  Risks, benefits, and alternatives were discussed and the patient expresses full understanding and desires to proceed.     Procedure Details:  The patient was taken to the operating room and placed on the table in the supine position.  General endotracheal anesthesia was administered and the abdomen was prepped and draped in the usual sterile fashion.  Compression boots were in place.  An incision was made in the right upper quadrant and access was gained using an Optiview non-bladed trocar.  The abdomen was insufflated with carbon dioxide to 18 mm of mercury.  The remaining trocars were inserted in standard position under direct visualization without complication.  Adhesions were taken down as needed to facilitate exposure.   The patient was placed in slight reverse Trendelenburg position and a liver retractor was placed.    After trocar placement was established an intraoperative EGD was performed to assess for gastric ulcer healing which was healed and brief reinspection of hiatus in  retroflexed view.    The pylorus was identified and marked with marking pen. A spot measured 5cm from this was marked with marking pen. Using this as a guide the lesser sac was entered via the gastrocolic ligament using harmonic scalple. This dissection was continued caudily to the left crus using the harmonic scalple. THe crus was cleared along with a periesophageal fat pad. There was no evidence of hiatal hernia as confirmed on preop and perioperative EGD. Using the previously marked spot as a guide the stomach was divided linearly over a 40 french bougie starting with black 4.4mm stapler reinforced with seamguard. We transitioned to a green 4.1mm stapler reinforced with seamguard. At the completion the staple line was inspected and hemostasis was perfect.    Intraoperative EGD was again performed which revealed good internal hemostasis, no area of stricturing or narrowing, no kinking or twisting. Simultaneous leak test was negative confirming an air tight sleeve. Consideration was given to repairing ventral hernia which is not obstructed and not causing problems but also was difficult to identifiy due to multiple loops of small and large bowe that were tightly adhesed the anterior wall. To have attempted repair would have been a very time consuming and risky procedure. There likely will be several enterotomy created due to the tight adhesions. Given the fresh staple line and time he had allready spent under anesthesia I felt it was safer to stop the dissection at this point rather than cause harm to the patient or delay their anesthesia recovery, he will require lysis of adhesios to   repair the hernia in the future.     Liver retracter was removed under direct vision. The belly was reinspected there were no iatrogenic injuries. The 12mm port fascia was closed with 0-vicryl using carter thompson under direct vision. Skin was closed with 4-0 monocryl after desuflating belly. Sponge count, Instrument count, Needle  count all correct. EBL 10ml. Returned to PACU in stable condition.

## 2016-01-24 NOTE — Progress Notes (Signed)
PACU Transfer Note    Vitals:    01/24/16 1145   BP: 109/67   Pulse: 89   Resp: 20   Temp: 97.6 ??F (36.4 ??C)   SpO2: 97%       In: 2342 [I.V.:2342]  Out: 30     Pain assessment:  present - adequately treated  Pain Level: 4    Report given to Receiving unit RN.    01/24/2016 11:57 AM

## 2016-01-24 NOTE — Brief Op Note (Signed)
Brief Postoperative Note    Nicholas KindredJames Wagner  Date of Birth:  07/07/1951  [phone]    Pre-operative Diagnosis: Obesity    Post-operative Diagnosis: Same    Procedure: Lap sleeve gastrectomy, EGD    Anesthesia: General    Surgeons/Assistants: Udelhofen/Sriansh Farra    Estimated Blood Loss: 10    Complications: None    Specimens: Was Obtained: Partial stomach    Findings: Negative leak test    Electronically signed by Lucianne MussLouis Ezrah Dembeck, MD on 01/24/2016 at 9:48 AM

## 2016-01-24 NOTE — Progress Notes (Signed)
Pt admitted to PACU 16 from OR post Lap sleeve gastrectomy, EGD per Dr. Shawnee KnappUdelhofen. Arrives in stable condition. PACU monitoring devices in place. Report received at bedside from CRNA, no intraoperative complications. Pt c/o pain on admit, medication given at bedside per anesthesia.

## 2016-01-24 NOTE — Progress Notes (Signed)
Patient arrived from PACU to room 5523 he is alert and oriented vital sign stable 5 lap sites to abdomen with surgical glue clean dry and intact. He denies nausea pain is controlled he has been up ambulating in room and in halls he has voided using IS tolerating bariatric clear liquid diet. Fall precautions in place will continue to monitor

## 2016-01-24 NOTE — Progress Notes (Signed)
[facility] PACU Education and Care Plan Goals  The following items will be achieved upon completion of the patient's transfer or discharge from the PACU:    Post Operative Pain Management                                                                               _0  Patient will verbalize understanding of pain scale and pain management.  _1  Patient achieves predetermined pain goal of 4  _2  Self reports a comfort level acceptable for discharge  _3  Other     Fall Risk Potential  _4  Due to Perioperative medication administration  Additional Risk Identified:   _5  Sensory deficit         _6  Motor deficit         _7  Balance problem         _8  Home medication         _9  Uses assistive device to ambulate    Goal(s) for fall prevention:  _10  Prevent fall or injury by calling for assistance with activity and use of siderails while hospitalized  _11  Prevent fall or injury by using assistance with activity after discharge.  _12  Patient / Significant other verbalize understanding in use of any ordered assistive devices    Mobility Safety/ ADL  _13  Reach a functional mobility goal within limitations of the procedure.    Infection Precautions                                                                                                            _14 Patient understands implementation of infection precautions (see [facility] Instructions and SSI Prevention Handout)    Post operative Assessment and Care                                                             _15  Standards of care met as delineated by [facility].                                                              Discharge Education and Goals  _16 Patient voices understanding of PACU discharge criteria  _17 Outpatient / significant other voices understanding of home care and follow up procedures (See [facility] Procedure Discharge Instructions)  _18  Patient / significant other understanding of Special Needs:  _19  Cooling device  _20 Wound Support Device  _21   Crutches   []Drain    [] Walker   []Other  [x] Inpatient / significant other understands the plan for transfer to the inpatient unit

## 2016-01-24 NOTE — Progress Notes (Signed)
OFIRMEV and Rocephin to OR   Type to lab

## 2016-01-24 NOTE — Anesthesia Post-Procedure Evaluation (Signed)
Anesthesia Post-op Note    Patient: Nicholas KindredJames Wagner  MRN: [id number]  Birthdate: 03/21/1951  Date of evaluation: 01/24/2016  Time:  11:44 AM     Procedure(s) Performed:     Last Vitals: BP 117/65   Pulse 88   Temp 97 ??F (36.1 ??C) (Temporal)    Resp 20   Ht 5\' 7"  (1.702 m)   Wt 228 lb 0.6 oz (103.4 kg)   SpO2 91%   BMI 35.72 kg/m2    Aldrete Phase I: Aldrete Score: 8    Aldrete Phase II:      Anesthesia Post Evaluation    Final anesthesia type: general  Patient location during evaluation: PACU  Patient participation: complete - patient participated  Level of consciousness: awake and alert  Pain score: 0  Airway patency: patent  Nausea & Vomiting: no nausea and no vomiting  Complications: no  Cardiovascular status: blood pressure returned to baseline  Respiratory status: acceptable  Hydration status: euvolemic        Violet Baldyavid W Arihaan Bellucci, MD  11:44 AM

## 2016-01-24 NOTE — Brief Op Note (Signed)
Facility: [facility]   PREOPERATIVE DIAGNOSIS:  Morbid obesity  POSTOPERATIVE DIAGNOSIS:  Morbid obesity   BMI:   36.97  PROCEDURE PERFORMED: Sleeve gastrectomy   (laparoscopic) 40 French ,  upper endoscopy and ventral hernia repair         Assistant: Lucianne Muss, MD Resident  Anesthesia: GETA  Estimated Blood Loss: 10 cc's  Complications: None      Indications for procedure:  This is an obese patient who came to see me in the office in consultation for weight loss surgery.  This patient is a good candidate for the procedure and has been thoroughly educated on the risks and benefits of the procedure including the possibility of bleeding, infection, and even death.  The patient has been extensively educated on post operative dietary requirements and the lifestyle changes needed for a successful outcome.  The patient has made a commitment to make the necessary dietary and lifestyle changes for a successful outcome.  Risks, benefits, and alternatives were discussed and the patient expresses full understanding and desires to proceed.     Procedure Details:  The patient was taken to the operating room and placed on the table in the supine position.  General endotracheal anesthesia was administered and the abdomen was prepped and draped in the usual sterile fashion.  Compression boots were in place.  An incision was made in the right upper quadrant and access was gained using an Optiview non-bladed trocar.  The abdomen was insufflated with carbon dioxide to 18 mm of mercury.  The remaining trocars were inserted in standard position under direct visualization without complication.  Adhesions were taken down as needed to facilitate exposure.   The patient was placed in slight reverse Trendelenburg position and a liver retractor was placed.    After trocar placement was established an intraoperative EGD was performed to assess for gastric ulcer healing which was healed and brief reinspection of hiatus in  retroflexed view.    The pylorus was identified and marked with marking pen. A spot measured 5cm from this was marked with marking pen. Using this as a guide the lesser sac was entered via the gastrocolic ligament using harmonic scalple. This dissection was continued caudily to the left crus using the harmonic scalple. THe crus was cleared along with a periesophageal fat pad. There was no evidence of hiatal hernia as confirmed on preop and perioperative EGD. Using the previously marked spot as a guide the stomach was divided linearly over a 40 french bougie starting with black 4.94mm stapler reinforced with seamguard. We transitioned to a green 4.62mm stapler reinforced with seamguard. At the completion the staple line was inspected and hemostasis was perfect.    Intraoperative EGD was again performed which revealed good internal hemostasis, no area of stricturing or narrowing, no kinking or twisting. Simultaneous leak test was negative confirming an air tight sleeve. Consideration was given to repairing ventral hernia which is not obstructed and not causing problems but also was difficult to identifiy due to multiple loops of small and large bowe that were tightly adhesed the anterior wall. To have attempted repair would have been a very time consuming and risky procedure. There likely will be several enterotomy created due to the tight adhesions. Given the fresh staple line and time he had allready spent under anesthesia I felt it was safer to stop the dissection at this point rather than cause harm to the patient or delay their anesthesia recovery, he will require lysis of adhesios to  repair the hernia in the future.     Liver retracter was removed under direct vision. The belly was reinspected there were no iatrogenic injuries. The 12mm port fascia was closed with 0-vicryl using carter thompson under direct vision. Skin was closed with 4-0 monocryl after desuflating belly. Sponge count, Instrument count, Needle  count all correct. EBL 10ml. Returned to PACU in stable condition.

## 2016-01-24 NOTE — Anesthesia Pre-Procedure Evaluation (Signed)
Department of Anesthesiology  Preprocedure Note       Name:  Nicholas Wagner   Age:  65 y.o.  DOB:  01/04/1951                                          MRN:  [id number]         Date:  01/24/2016      Surgeon: Shawnee Knappudelhofen    Procedure: lap sleeve gastrectomy    Medications prior to admission:   Prior to Admission medications    Medication Sig Start Date End Date Taking? Authorizing Provider   amLODIPine (NORVASC) 10 MG tablet Take 10 mg by mouth daily   Yes Historical Provider, MD   clopidogrel (PLAVIX) 75 MG tablet Take 75 mg by mouth daily   Yes Historical Provider, MD   metoprolol succinate (TOPROL XL) 50 MG extended release tablet Take 50 mg by mouth daily   Yes Historical Provider, MD   sucralfate (CARAFATE) 1 GM tablet Take 1 g by mouth 4 times daily   Yes Historical Provider, MD   omeprazole (PRILOSEC) 20 MG delayed release capsule Take 40 mg by mouth daily   Yes Historical Provider, MD       Current medications:    Current Facility-Administered Medications   Medication Dose Route Frequency Provider Last Rate Last Dose   ??? lactated ringers infusion   Intravenous Continuous Jari FavreSteven M Udelhofen, DO 100 mL/hr at 01/24/16 [id number]     ??? lactated ringers infusion   Intravenous Continuous Ashima Dhamija, MD 100 mL/hr at 01/24/16 0653     ??? acetaminophen (OFIRMEV) infusion 1,000 mg  1,000 mg Intravenous Once Jari FavreSteven M Udelhofen, DO       ??? cefTRIAXone (ROCEPHIN) 2 g IVPB in D5W 50ml minibag  2 g Intravenous On Call to OR Jari FavreSteven M Udelhofen, DO       ??? chlorhexidine (PERIDEX) 0.12 % solution 15 mL  15 mL Mouth/Throat BID Gwenyth BenderShaka Dacian, MD   15 mL at 01/24/16 [id number]   ??? lactated ringers infusion   Intravenous Continuous Graciella Beltonavid William Blakelee Allington, MD       ??? sodium chloride flush 0.9 % injection 10 mL  10 mL Intravenous 2 times per day Graciella Beltonavid William Margherita Collyer, MD       ??? sodium chloride flush 0.9 % injection 10 mL  10 mL Intravenous PRN Graciella Beltonavid William Jaely Silman, MD           Allergies:  No Known Allergies    Problem List:  There is no problem list on  file for this patient.      Past Medical History:        Diagnosis Date   ??? Arthritis    ??? Hernia    ??? Hx of blood clots 2912    after shoulder surgery   ??? Hypertension    ??? Obesity        Past Surgical History:        Procedure Laterality Date   ??? BACK SURGERY  1983    fusion   ??? CARDIAC CATHETERIZATION  2012   ??? CATARACT REMOVAL WITH IMPLANT Bilateral    ??? HERNIA REPAIR  2009   ??? KNEE SURGERY Bilateral     injections and drainings   ??? ROTATOR CUFF REPAIR Left    ??? UPPER GASTROINTESTINAL ENDOSCOPY         Social  History:    Social History   Substance Use Topics   ??? Smoking status: Never Smoker   ??? Smokeless tobacco: Not on file   ??? Alcohol use Yes      Comment: OCC                                Counseling given: Not Answered      Vital Signs (Current):   Vitals:    01/24/16 0609   BP: 128/78   Pulse: 78   Resp: 18   Temp: 97.9 ??F (36.6 ??C)   TempSrc: Oral   SpO2: 94%   Weight: 228 lb 0.6 oz (103.4 kg)   Height:  (1.702 m)                                              BP Readings from Last 3 Encounters:   01/24/16 128/78   01/12/16 (!) 141/89       NPO Status: Time of last liquid consumption: 2300                        Time of last solid consumption: 1300                        Date of last liquid consumption: 01/23/16                        Date of last solid food consumption: 01/23/16    BMI:   Wt Readings from Last 3 Encounters:   01/24/16 228 lb 0.6 oz (103.4 kg)   01/12/16 241 lb (109.3 kg)     Body mass index is 35.72 kg/(m^2).    Anesthesia Evaluation   no history of anesthetic complications:   Airway: Mallampati: II  TM distance: >3 FB   Neck ROM: full  Mouth opening: > = 3 FB Dental:      Comment: Cap upper front    Pulmonary:       ROS comment: Denies Asthma, COPD, Smoking   Cardiovascular:    (+) hypertension (took meds last PM):,          ROS comment: Denies CP, SOA with moderate exercise        Neuro/Psych:   {neg ROS     GI/Hepatic/Renal:        (-) GERD and liver disease     Endo/Other:         (-) hypothyroidism    Abdominal:                    Anesthesia Plan    ASA 2     general     intravenous induction   Anesthetic plan and risks discussed with patient.            Violet Baldy, MD   01/24/2016

## 2016-01-24 NOTE — Progress Notes (Signed)
Incentive Spirometry education and demonstration completed by Respiratory Therapy.  Turning over to Nursing for routine follow-up.  Minimum Predicted Vital Capacity - 1006 mL.  Patient's Actual Vital Capacity - 1750 mL.

## 2016-01-25 MED ORDER — OXYCODONE HCL 5 MG PO TABS
5 MG | ORAL | Status: DC | PRN
Start: 2016-01-25 — End: 2016-01-25
  Administered 2016-01-25: 12:00:00 5 mg via ORAL

## 2016-01-25 MED ORDER — OXYCODONE HCL 5 MG PO TABS
5 MG | ORAL | Status: DC | PRN
Start: 2016-01-25 — End: 2016-01-25

## 2016-01-25 MED ORDER — ENOXAPARIN SODIUM 40 MG/0.4ML SC SOLN
40 MG/0.4ML | INJECTION | Freq: Every day | SUBCUTANEOUS | 0 refills | Status: AC
Start: 2016-01-25 — End: 2016-02-08

## 2016-01-25 MED FILL — OXYCODONE HCL 5 MG/5ML PO SOLN: 5 MG/ML | ORAL | Qty: 10

## 2016-01-25 MED FILL — LOVENOX 30 MG/0.3ML SC SOLN: 30 MG/0.3ML | SUBCUTANEOUS | Qty: 0.3

## 2016-01-25 MED FILL — OXYCODONE HCL 5 MG PO TABS: 5 MG | ORAL | Qty: 1

## 2016-01-25 MED FILL — LACTATED RINGERS IV SOLN: INTRAVENOUS | Qty: 1000

## 2016-01-25 MED FILL — PANTOPRAZOLE SODIUM 40 MG PO TBEC: 40 MG | ORAL | Qty: 1

## 2016-01-25 MED FILL — AMLODIPINE BESYLATE 10 MG PO TABS: 10 MG | ORAL | Qty: 1

## 2016-01-25 MED FILL — TOPROL XL 50 MG PO TB24: 50 MG | ORAL | Qty: 1

## 2016-01-25 NOTE — Discharge Summary (Signed)
Physician Discharge Summary     Patient ID:  Nicholas KindredJames Pavel  [id number]  65 y.o.  02/17/1951    Admit date: 01/24/2016    Discharge date: 01/25/2016    Admitting Physician: Audie PintoSteven M Udelhofen, DO     Discharge Physician: Merita NortonSteven Udelhofen, DO    Admission Diagnoses: Drug-induced obesity [E66.1]    Discharge Diagnoses: Obesity, s/p laparoscopic sleeve gastrectomy, intraoperative EGD, and ventral hernia repair    Admission Condition: good    Discharged Condition: good    Indication for Admission: Surgery: laparoscopic sleeve gastrectomy, intraoperative EGD, and ventral hernia repair; IV hydration, monitoring, pain and nausea management    Hospital Course: 65 y.o. male admitted with morbid obesity who underwent laparoscopic sleeve gastrectomy, intraoperative EGD, and ventral hernia repair.  Surgery was uneventful and he was admitted to bariatric post-operative surgical floor in stable condition for IV hydration, monitoring and pain and nausea management.  The following morning the pain was tolerable on po pain medication and was taking adequate po. He was discharged in stable condition.    Treatments: IV hydration    Disposition: home    Patient Instructions:   Activity: activity as tolerated and no driving while on analgesics  Diet: clear liquids  Wound Care: as directed    Follow-up with Dr. Curry/Dr. Ramond MarrowPitt in one week.        Signed:  Fletcher AnonKerry Tan Clopper  01/27/2016  7:34 AM

## 2016-01-25 NOTE — Progress Notes (Signed)
Patient alert and oriented, vitals within normal limits. Patient ambulating the halls frequently, tolerating well. Patient tolerating bariatric clears, denies nausea or vomiting, +BS, +flatus. Surgical lap sites clean, dry, and intact. Patient awaiting discharge. Will continue to monitor.

## 2016-01-25 NOTE — Progress Notes (Signed)
Bariatric Surgery Daily Progress Note  Nicholas Wagner    CC: Morbid Obesity    Subjective :   Patient resting well overnight.  He states that his pain is well controlled. Tolerating bariatric clear liquids without nausea or vomiting.  He has been able to ambulate in the room and in the halls since the OR.  Voiding without difficulty.       Objective    Infusions:  ??? lactated ringers 125 mL/hr at 01/24/16 1119        I/O:I/O last 3 completed shifts:  In: 2342 [I.V.:2342]  Out: 780 [Urine:750; Blood:30]           Wt Readings from Last 1 Encounters:   01/24/16 228 lb 0.6 oz (103.4 kg)                 LABS: No results for input(s): WBC, HGB, HCT, MCV, PLT in the last 72 hours.   No results for input(s): NA, K, CL, CO2, PHOS, BUN, CREATININE in the last 72 hours.    Invalid input(s): CA   No results for input(s): AST, ALT, ALB, BILIDIR, BILITOT, ALKPHOS in the last 72 hours.   No results for input(s): LIPASE, AMYLASE in the last 72 hours.   No results for input(s): PROT, INR, APTT in the last 72 hours.   No results for input(s): CKTOTAL, CKMB, CKMBINDEX, TROPONINI in the last 72 hours.         Exam:  BP 125/80   Pulse 76   Temp 98 ??F (36.7 ??C) (Oral)    Resp 16   Ht 5\' 7"  (1.702 m)   Wt 228 lb 0.6 oz (103.4 kg)   SpO2 91%   BMI 35.72 kg/m2    CONSTITUTIONAL: awake, alert, cooperative, no apparent distress  LUNGS: CTAB, no R/R/W  CV: RRR, no m/r/g  ABDOMEN: Soft, obese, appropriately tender, incisions c/d/i and well approximated without surrounding erythema or ecchymosis     ASSESSMENT/PLAN:   Pt. is a 65 y.o. male POD# 1 s/p Sleeve gastrectomy, EGD, ventral hernia repair (5/15)    ?? Continue Bariatric clear liquids  ?? LR @ 125 until discharge   ?? Roxicodone for pain control  ?? Continue home Norvasc and Toprol XL- will discuss with staff timing of restarting Plavix  ?? Encourage ambulation and deep breathing   ?? Dispo: Discharge home later this AM-  Patient will need to go home with 2 weeks of Lovenox-  Script send with face  sheet to outpatient                   pharmacy for meds to beds       Darlyne Russianourtney R Jacquita Mulhearn, MD  5:34 AM  01/25/2016  [phone]

## 2016-01-25 NOTE — Other (Addendum)
Patient Acct Nbr:  [id number]  Primary AUTH/CERT:    Primary Insurance Company Name:   MEDICARE  Primary Insurance Plan Name:  MEDICARE INPT/OUTPT  Primary Insurance Group Number:    [facility]  Primary Insurance Policy Number:  [id number]    Secondary AUTH/CERT:    Secondary Insurance Company Name:   AARP/AARP [id number]  Secondary Insurance Plan Name:  [facility]  Secondary Insurance Group Number:  [id number] [id number]  Secondary Insurance Plan Type: C  Secondary Insurance Policy Number:  [id number]

## 2016-01-25 NOTE — Progress Notes (Signed)
Patient discharged with belongings, new medication and follow up instructions reviewed. IV removed without complications. Patient ambulated to main entrance, friend waiting at curbside.

## 2016-01-25 NOTE — Plan of Care (Signed)
Problem: Pain:  Goal: Pain level will decrease  Pain level will decrease   Outcome: Met This Shift  Patient complains of pain at surgical incisions. Lap sites open to air, remain clean, dry, and intact. Patient medicated with PO pain medication, pain control achieved.     Problem: Pain:  Goal: Pain level will decrease  Pain level will decrease   Outcome: Met This Shift  Patient complains of pain at surgical incisions. Lap sites open to air, remain clean, dry, and intact. Patient medicated with PO pain medication, pain control achieved.

## 2016-01-25 NOTE — Discharge Instructions (Signed)

## 2016-01-25 NOTE — Progress Notes (Signed)
VSS, Pt is A/O x4. Pt given PO pain medication per MAR. Pt complains of a feeling of fullness. Rn suggested to stop drinking until the feeling subsides. Pt denies nausea. Lap sites are CDI. Abdomen is rounded, soft and tender. Pt ambulated the halls and tolerated well. Pt has a steady gait and is up at liberty. Pt voiding per urinal. Intake education reiterated. Bed alarm on, wheels locked, bed in lowest position, side rails up 2/4, nonskid socks on, call light and bedside table in reach. Will continue to monitor.

## 2016-01-25 NOTE — Progress Notes (Addendum)
Physician Discharge Summary     Patient ID:  Nicholas Wagner  [id number]  65 y.o.  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 male admitted with morbid obesity who underwent laparoscopic sleeve gastrectomy, intraoperative EGD, and ventral hernia repair.  Surgery was uneventful and he was admitted to bariatric post-operative surgical floor in stable condition for IV hydration, monitoring and pain and nausea management.  The following morning the pain was tolerable on po pain medication and was taking adequate po. He was discharged in stable condition.    Treatments: IV hydration    Disposition: home    Patient Instructions:   Activity: activity as tolerated and no driving while on analgesics  Diet: clear liquids  Wound Care: as directed    Follow-up with Dr. Curry/Dr. Ramond MarrowPitt in one week.        Signed:  Fletcher AnonKerry Storms  01/27/2016  7:34 AM     Pt seen on day of d/c and agree with above with no changes.

## 2016-02-23 IMAGING — NM BONE SCAN WHOLE BODY
1 series · 2 of 2 positions shown · non-contrast
Comparison: none

BONE SCAN WHOLE BODY, 02/23/2016 [DATE]:
CLINICAL INDICATION:  Staging of prostate cancer. Comparison made to CT from
02/23/2016
TECHNIQUE: Following the injection of 27.8 mCi 99m MDP scanning of the entire
skeleton was performed.

[whole body · 2.26mm/px · 2 of 2 frames shown]
[frame 1/2]
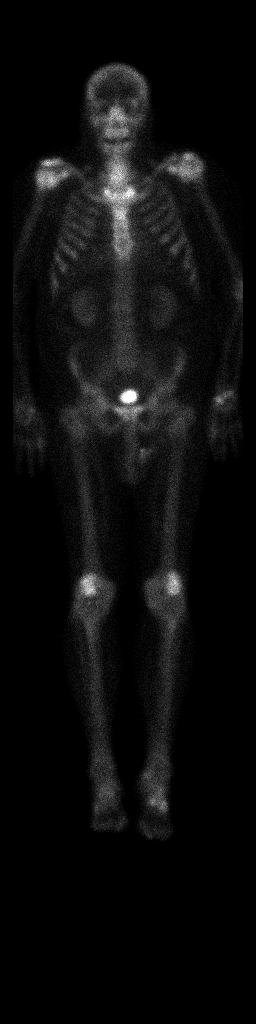
[frame 2/2]
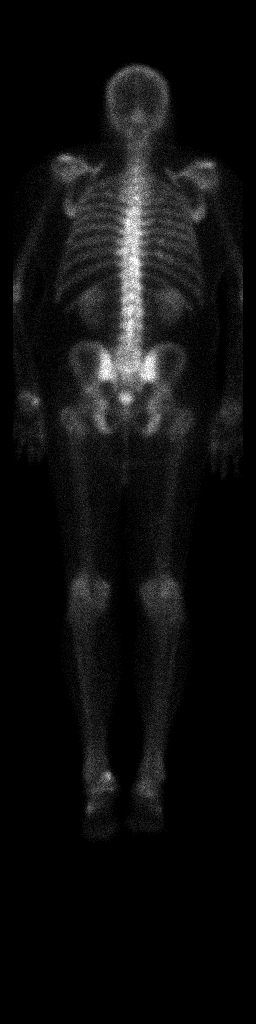

[2 of 2 positions shown; findings below may reference images not displayed]

FINDINGS: There is some symmetric increased uptake seen in the patellas
bilaterally and shoulders compatible with degenerative changes as well as some
suspected degenerative changes of the left ankle. No evidence of metastatic
disease seen on bone scan. No focally increased uptake seen over the pelvis or
lumbar spine.
IMPRESSION: Degenerative changes as described above with no evidence of metastatic
disease
seen on bone scan.

## 2016-02-23 IMAGING — CT CT ABDOMEN AND PELVIS WITH CONTRAST
2 of 3 series · 16 of 46 positions shown, 18 images · IV contrast (isovue)
Comparison: There are no previous exams available for comparison.

CT ABDOMEN AND PELVIS WITH CONTRAST, 02/23/2016 [DATE]:
CLINICAL INDICATION:  Prostate cancer. Elevated PSA. Positive biopsy.
A search for DICOM formatted images was conducted for prior CT imaging studies
completed at a non-affiliated media free facility.
TECHNIQUE: The region of interest was scanned with contrast on a high
resolution low dose CT scanner.  100 cc's of Isovue 300 was injected
intravenously.  Routine MPR reconstructions were performed.

[Series 5: abd/pel ax w · axial · 0.90mm/px · z∈[-464,-5]mm · 13 of 177 slices shown, 15 images]
[im 12/177  soft-tissue]
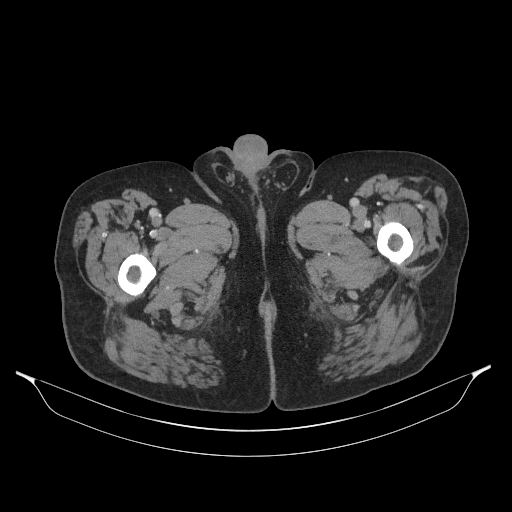
[im 12/177  bone]
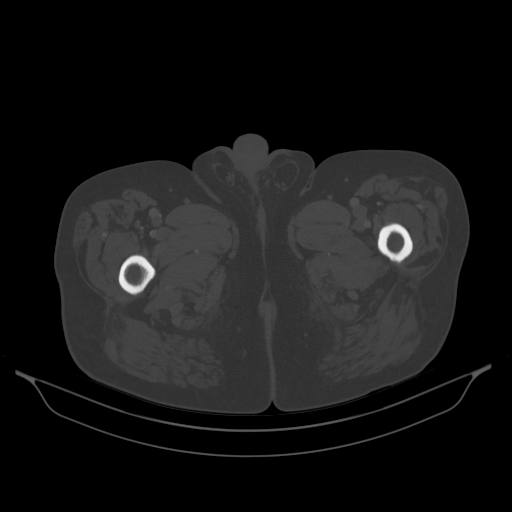
[im 23/177  soft-tissue]
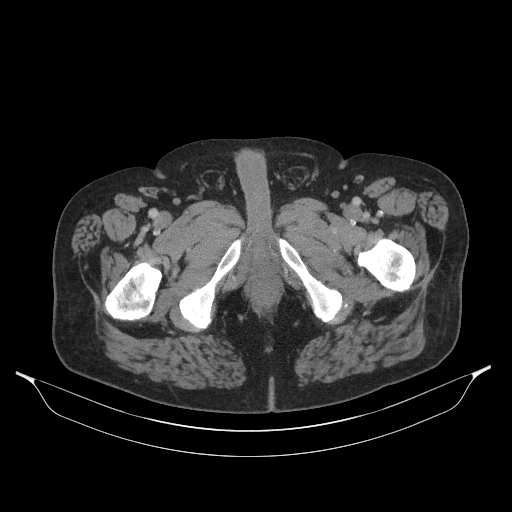
[im 35/177  soft-tissue]
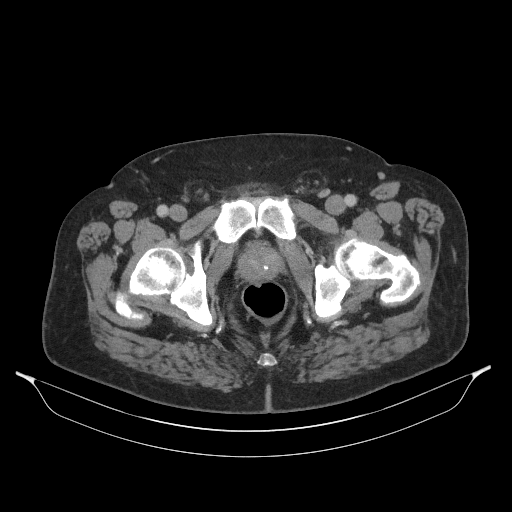
[im 52/177  soft-tissue]
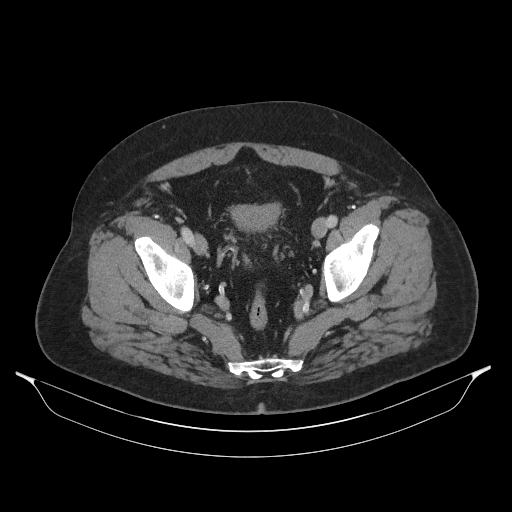
[im 63/177  soft-tissue]
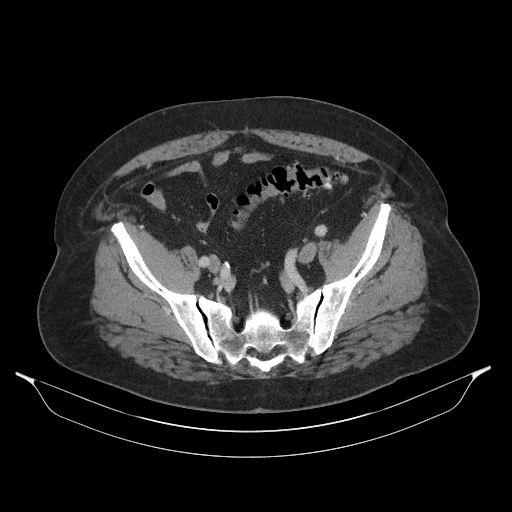
[im 74/177  soft-tissue]
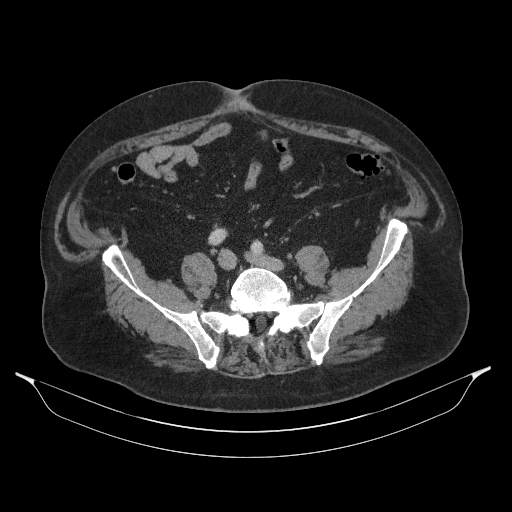
[im 91/177  soft-tissue]
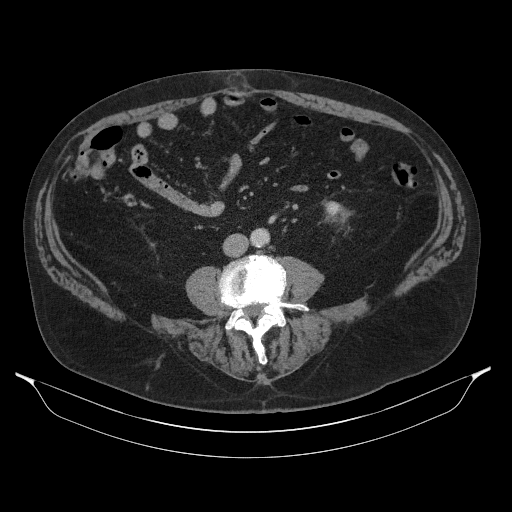
[im 103/177  soft-tissue]
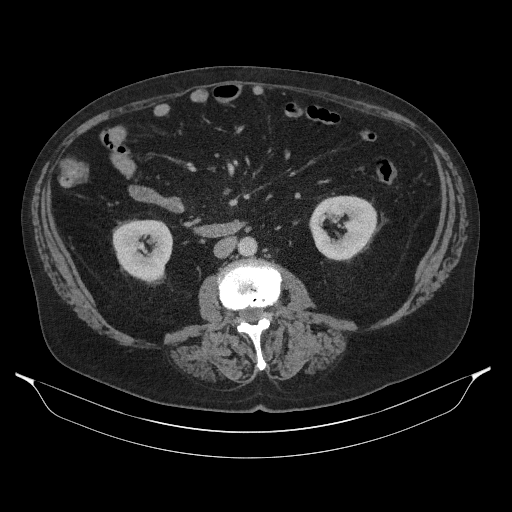
[im 114/177  soft-tissue]
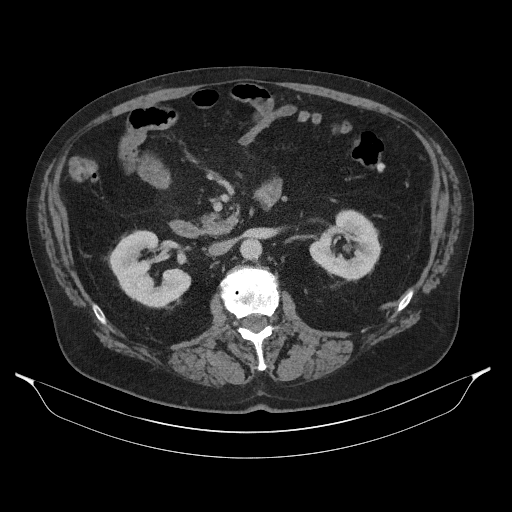
[im 114/177  bone]
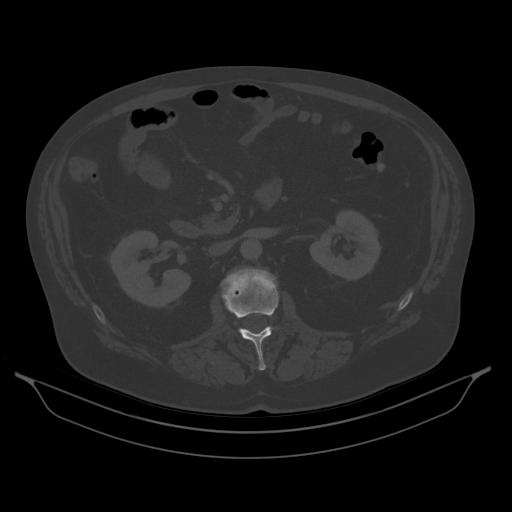
[im 125/177  soft-tissue]
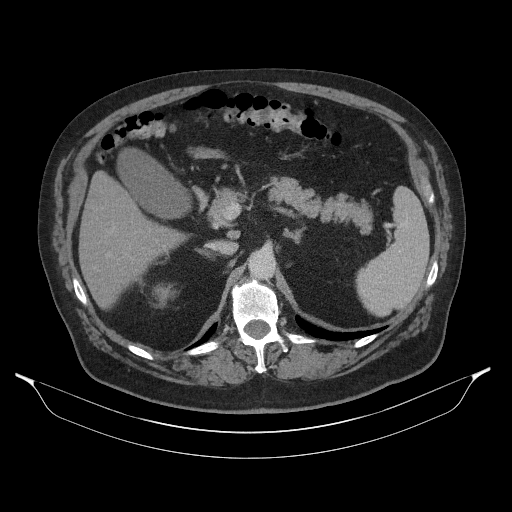
[im 142/177  soft-tissue]
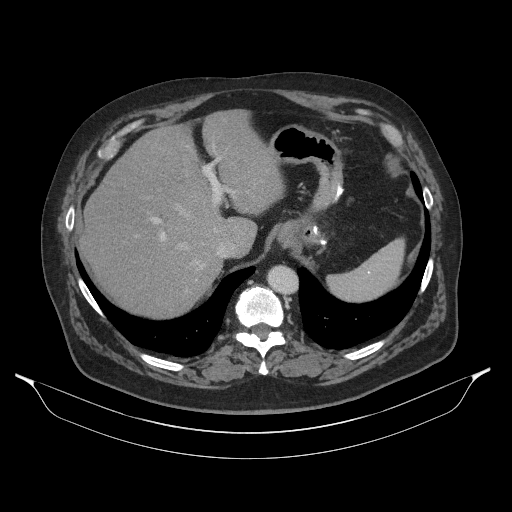
[im 154/177  soft-tissue]
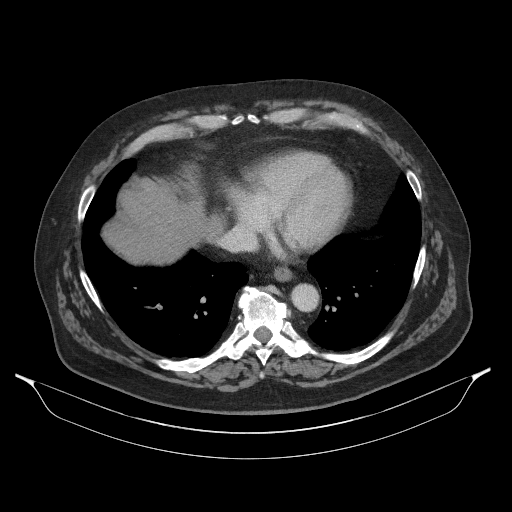
[im 165/177  soft-tissue]
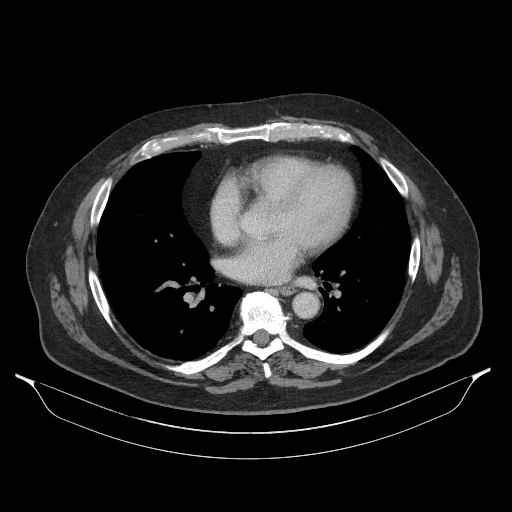

[Series 6: abd/pel cor w · coronal · 0.86mm/px · 3 of 145 slices shown]
[im 49/145  soft-tissue]
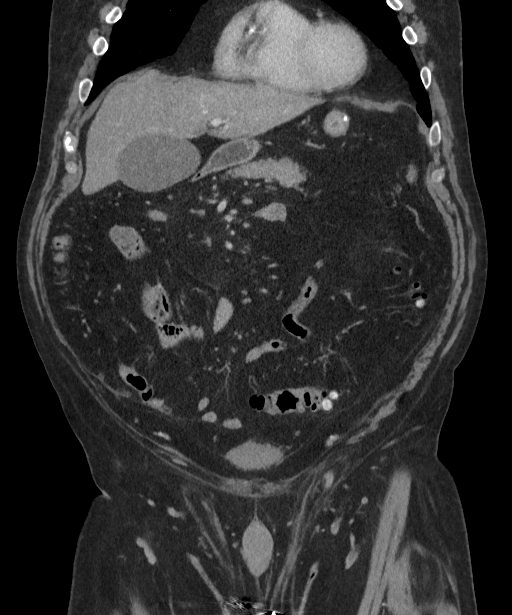
[im 65/145  soft-tissue]
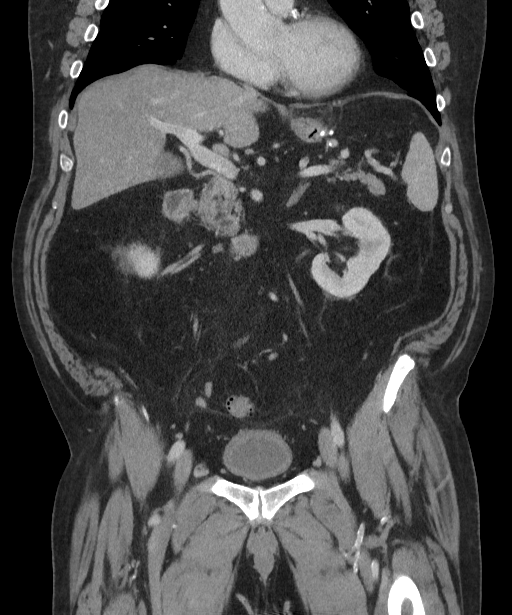
[im 81/145  soft-tissue]
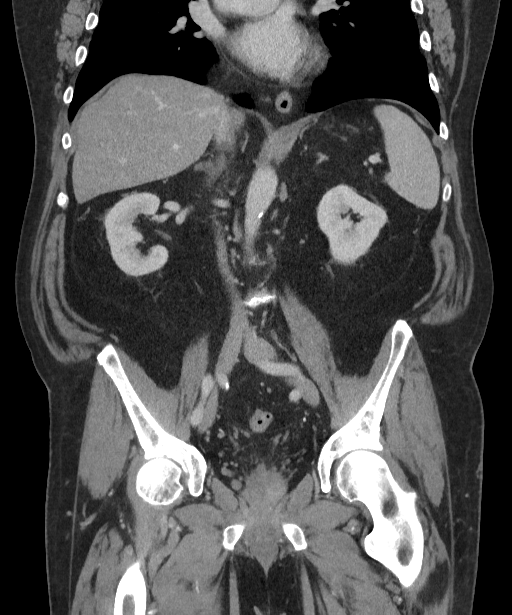

[16 of 46 positions shown; findings below may reference images not displayed]

FINDINGS: The lower lungs are clear. Heart size is normal. Moderate coronary
calcifications. Suture material is noted along the greater curvature of the
stomach from bypass surgery. Mild fatty liver. Normal gallbladder, pancreas
and
spleen. Normal adrenals. The kidneys are normal in size and function. No
hydronephrosis. Calcified aorta is not dilated. Small umbilical hernia,
containing fat.
Extensive diverticulosis is present, primarily in the descending and sigmoid
colon. Normal small bowel. Normal appendix. The bladder is almost empty. The
prostate is not enlarged, containing calcifications. No pelvic lymphadenopathy
noted. The seminal vesicles are symmetrical. Degenerative change in the spine.
No blastic metastases.
IMPRESSION: The prostate is not enlarged, containing calcifications. No pelvic
lymphadenopathy. No blastic metastases.
RADIATION DOSE REDUCTION: All CT scans are performed using radiation dose
reduction techniques, when applicable.  Technical factors are evaluated and
adjusted to ensure appropriate moderation of exposure.  Automated dose
management technology is applied to adjust the radiation doses to minimize
exposure while achieving diagnostic quality images.

## 2021-12-13 IMAGING — CT CT ABDOMEN PELVIS W/ UROGRAM
2 of 6 series · 15 of 46 positions shown, 17 images · IV contrast (APPLIED)
Comparison: There are no prior exam(s) available for comparison within the past 
12 months; however, comparison was made to the prior exam(s) February 2016 CT scan.

________________________________________________________________________________________________ 
CT ABDOMEN PELVIS W/ UROGRAM, 12/13/2021 [DATE]: 
CLINICAL INDICATION: Gross hematuria. History of prostate carcinoma treated in 
0976. 
A search for DICOM formatted images was conducted for prior CT imaging studies 
completed at a non-affiliated media free facility.
TECHNIQUE: The region of interest was scanned without and with 100 mL of Isovue 
300 contrast injected intravenously on a high resolution CT scanner using dose 
reduction techniques.  Routine MPR reconstructions were performed. The patients 
eGFR was calculated to be 132.4 mL/min/1.73 m2 using the i-STAT device.

[Series 5: portal · axial · portal-venous · 0.84mm/px · z∈[-486,-57]mm · 12 of 169 slices shown, 14 images]
[im 13/169  soft-tissue]
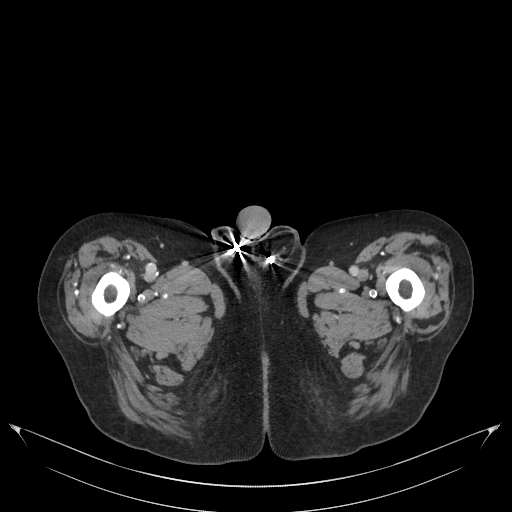
[im 13/169  bone]
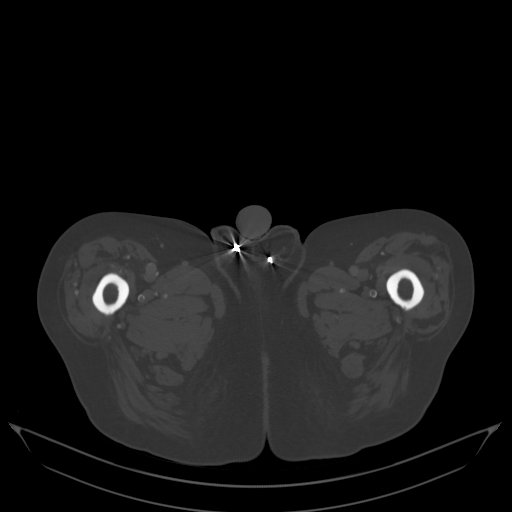
[im 26/169  soft-tissue]
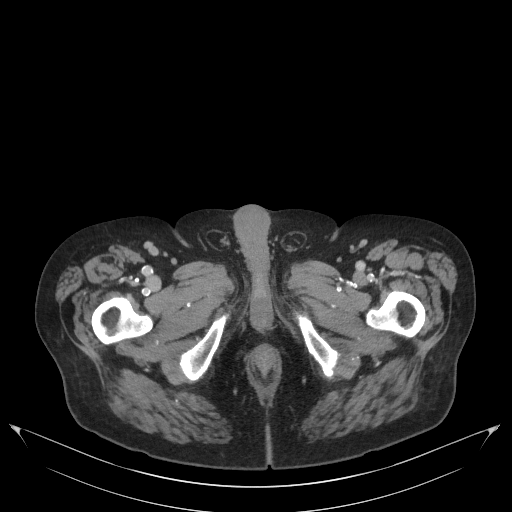
[im 39/169  soft-tissue]
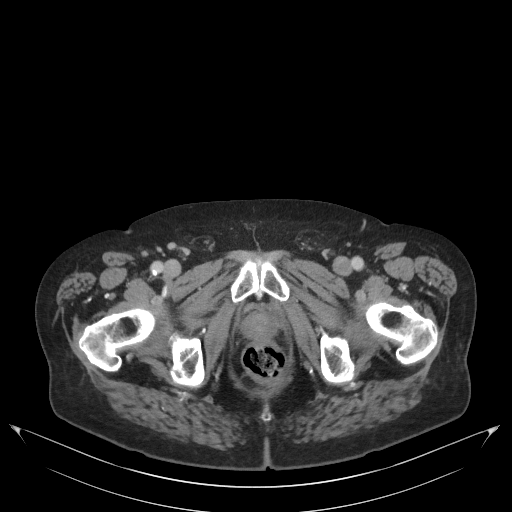
[im 52/169  soft-tissue]
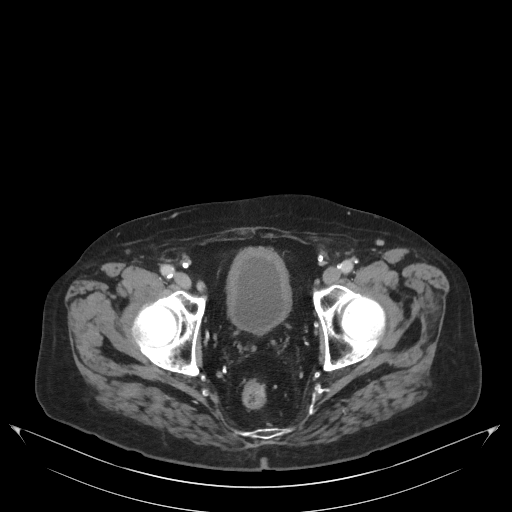
[im 65/169  soft-tissue]
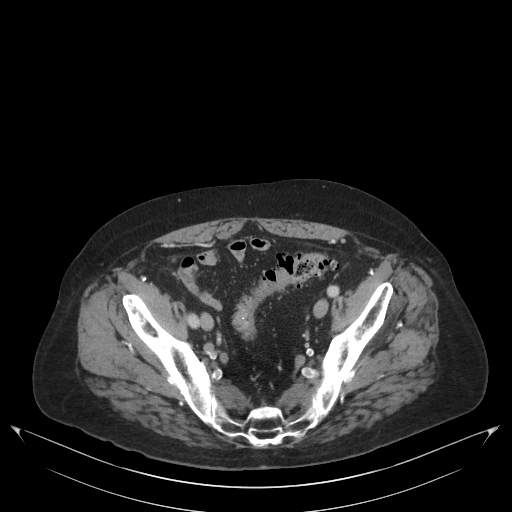
[im 78/169  soft-tissue]
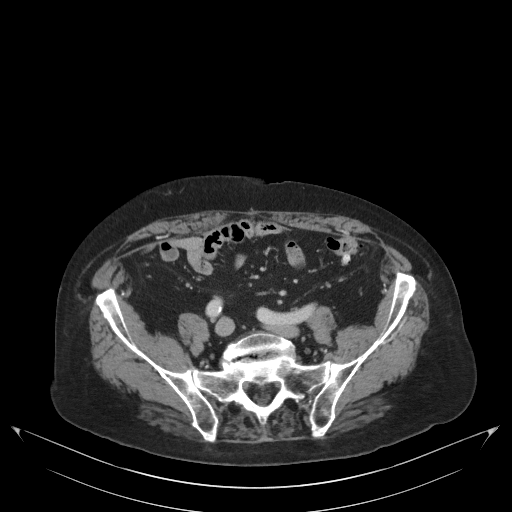
[im 91/169  soft-tissue]
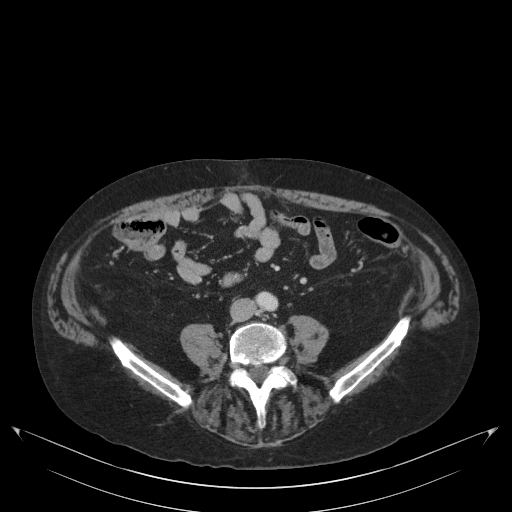
[im 104/169  soft-tissue]
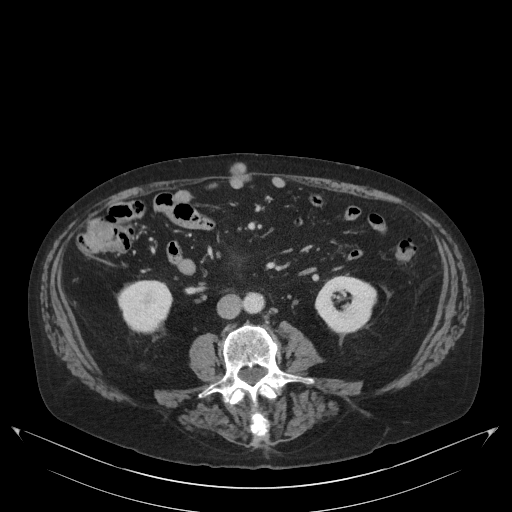
[im 117/169  soft-tissue]
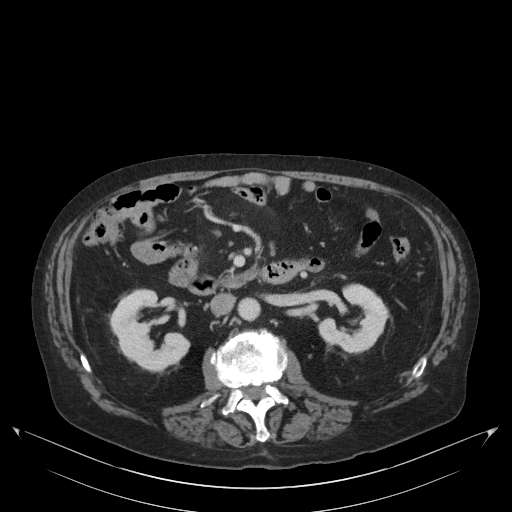
[im 117/169  bone]
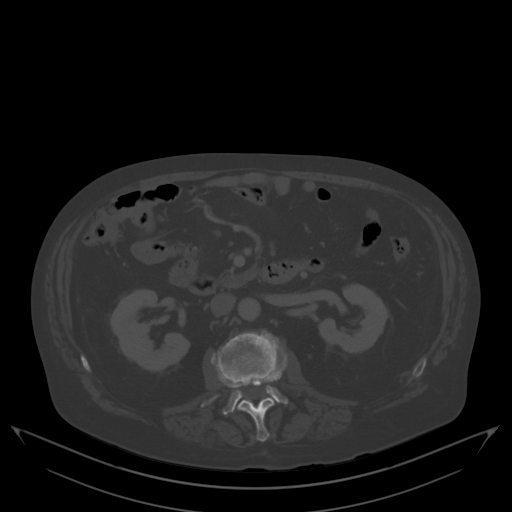
[im 130/169  soft-tissue]
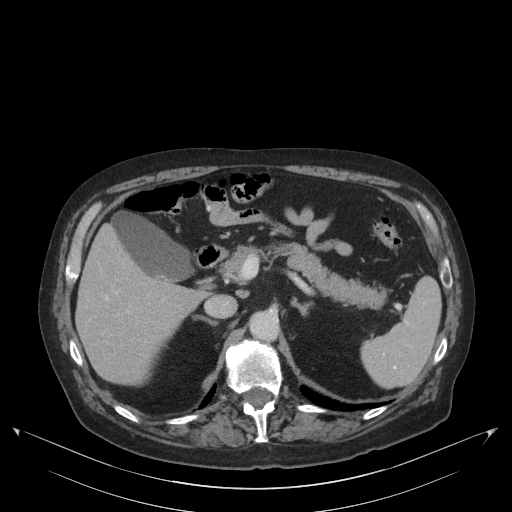
[im 143/169  soft-tissue]
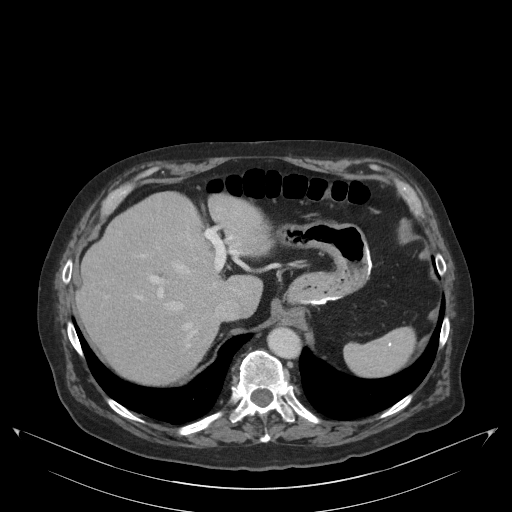
[im 156/169  soft-tissue]
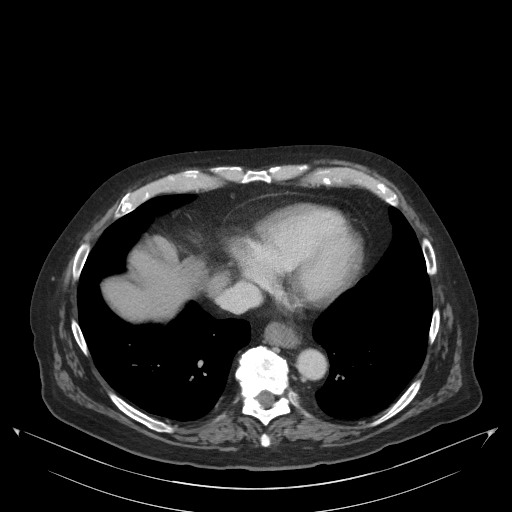

[Series 6: coronal · coronal · 0.77mm/px · 3 of 131 slices shown]
[im 33/131  soft-tissue]
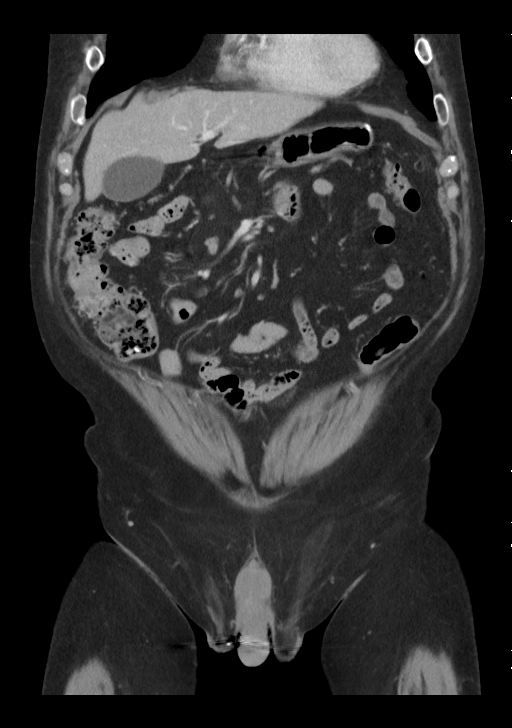
[im 66/131  soft-tissue]
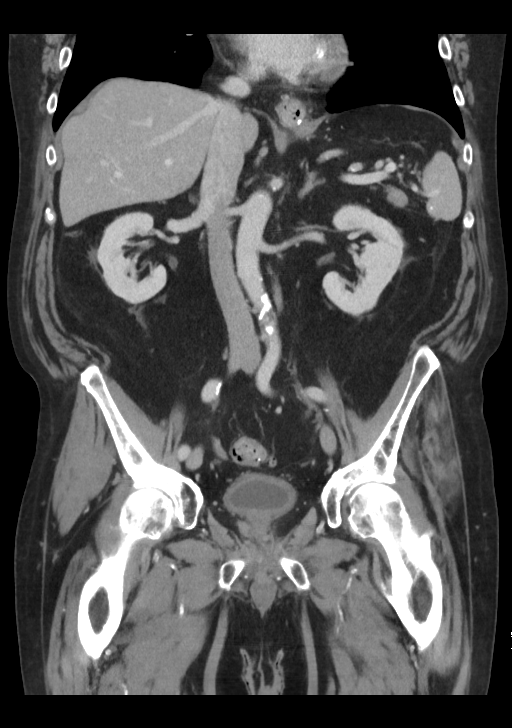
[im 98/131  soft-tissue]
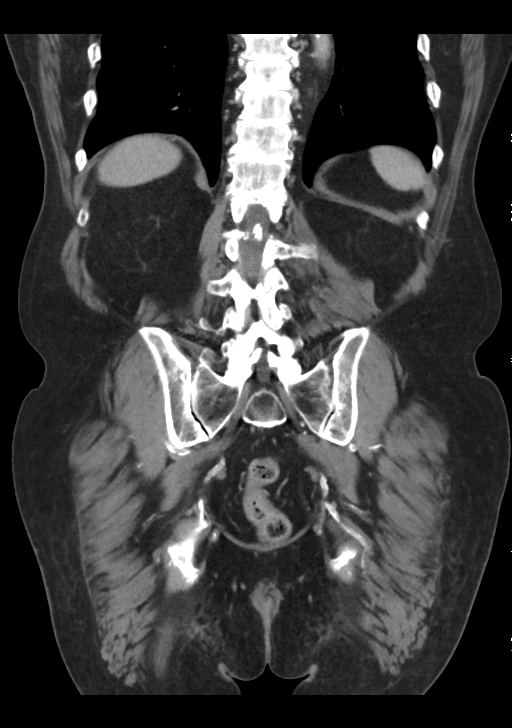

[15 of 46 positions shown; findings below may reference images not displayed]

FINDINGS: KIDNEYS: No masses.  No hydronephrosis.  No nephrolithiasis. 
URETERS: Normal caliber without filling defects.   
BLADDER: No mass.  Diffuse bladder wall thickening.  No stone. 
LUNG BASES: Clear. 
HEPATOBILIARY: No mass or biliary dilatation. Gallstones. 
SPLEEN: Calcified granulomata seen within the spleen. 
PANCREAS: No mass.  No pancreatic fluid collections. 
ADRENALS: No masses. 
LYMPH NODES: No adenopathy. 
STOMACH, SMALL BOWEL AND COLON: Postoperative changes involving the stomach. 
There are 2 separate anterior abdominal wall hernias through the rectus sheath. 
The more superior contains nonobstructing loops of small bowel and the more 
inferior nonobstructing loops also of small bowel. Diverticulosis. 
VASCULAR STRUCTURES: Atherosclerotic changes without aneurysmal dilatation.  
MUSCULOSKELETAL: No acute osseous abnormality. Scattered degenerative changes.  
ADDITIONAL FINDINGS: Calcification seen within a small prostate. Clips 
identified overlying the inguinal canals bilaterally. Question previous 
vasectomy.
IMPRESSION: Diffuse bladder wall thickening. Nonspecific. 
Postop changes involving the stomach with 2 separate anterior abdominal wall 
hernias containing nonobstructing loops of small bowel. 
RADIATION DOSE REDUCTION: All CT scans are performed using radiation dose 
reduction techniques, when applicable.  Technical factors are evaluated and 
adjusted to ensure appropriate moderation of exposure.  Automated dose 
management technology is applied to adjust the radiation doses to minimize 
exposure while achieving diagnostic quality images.

## 2024-01-01 IMAGING — MR MRI BRAIN W/WO CONTRAST
2 of 17 series · 5 of 48 positions shown · IV contrast (gadavist)
Comparison: None.

________________________________________________________________________________________________ 
MRI BRAIN W/WO CONTRAST, 01/01/2024 [DATE]: 
CLINICAL INDICATION: Other Specified Coagulation Defects. Secondary Malignant 
Neoplasm Of Liver And Intrahepatic Bile Duct
TECHNIQUE: Multiplanar, multiecho position MR images of the brain were performed 
without and with 8.5 mL of Gadavist were injected intravenously by hand. 1.5 mL 
of Gadavist discarded. Patient was scanned on a 1.5T magnet.

[Series 1002: T1 post-contrast · coronal · 1.0mm · 0.24mm/px · 3 of 179 slices shown (1 of 2)]
[im 26/179]
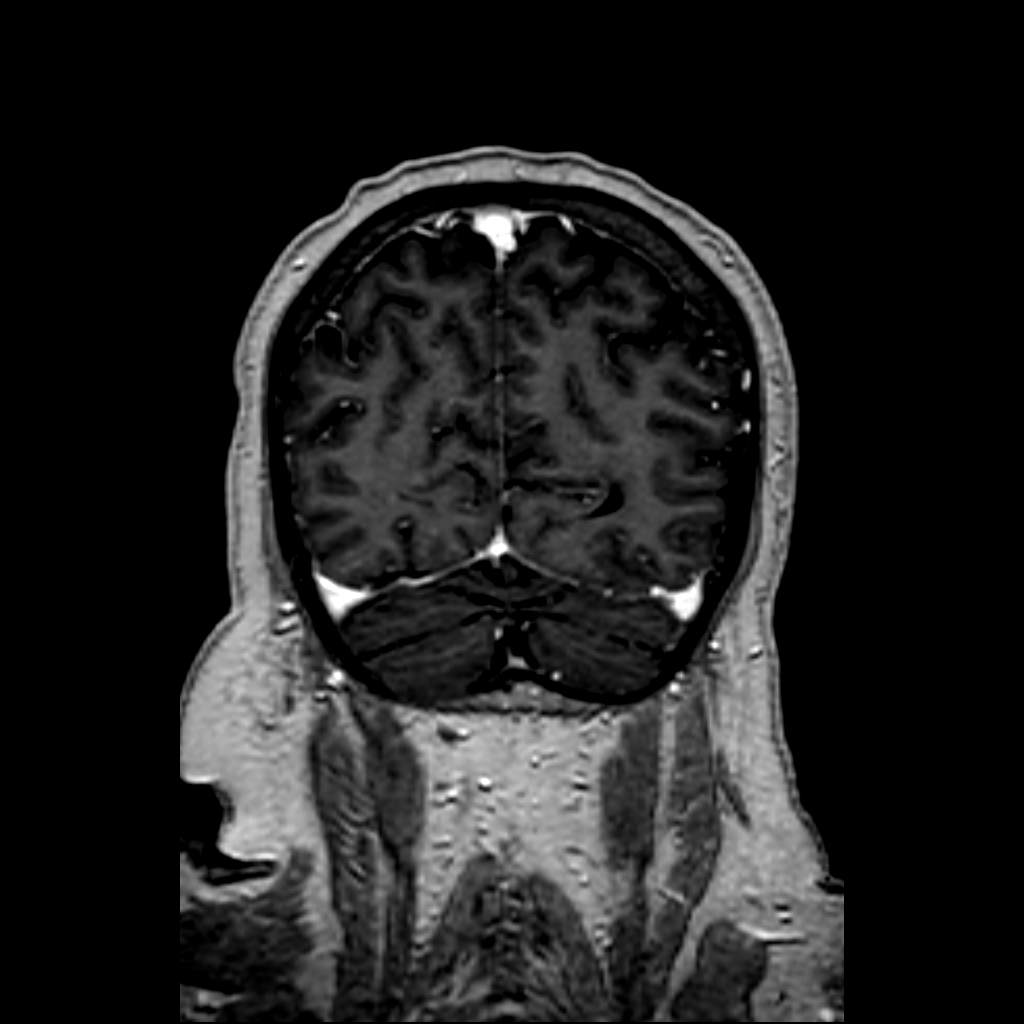
[im 102/179]
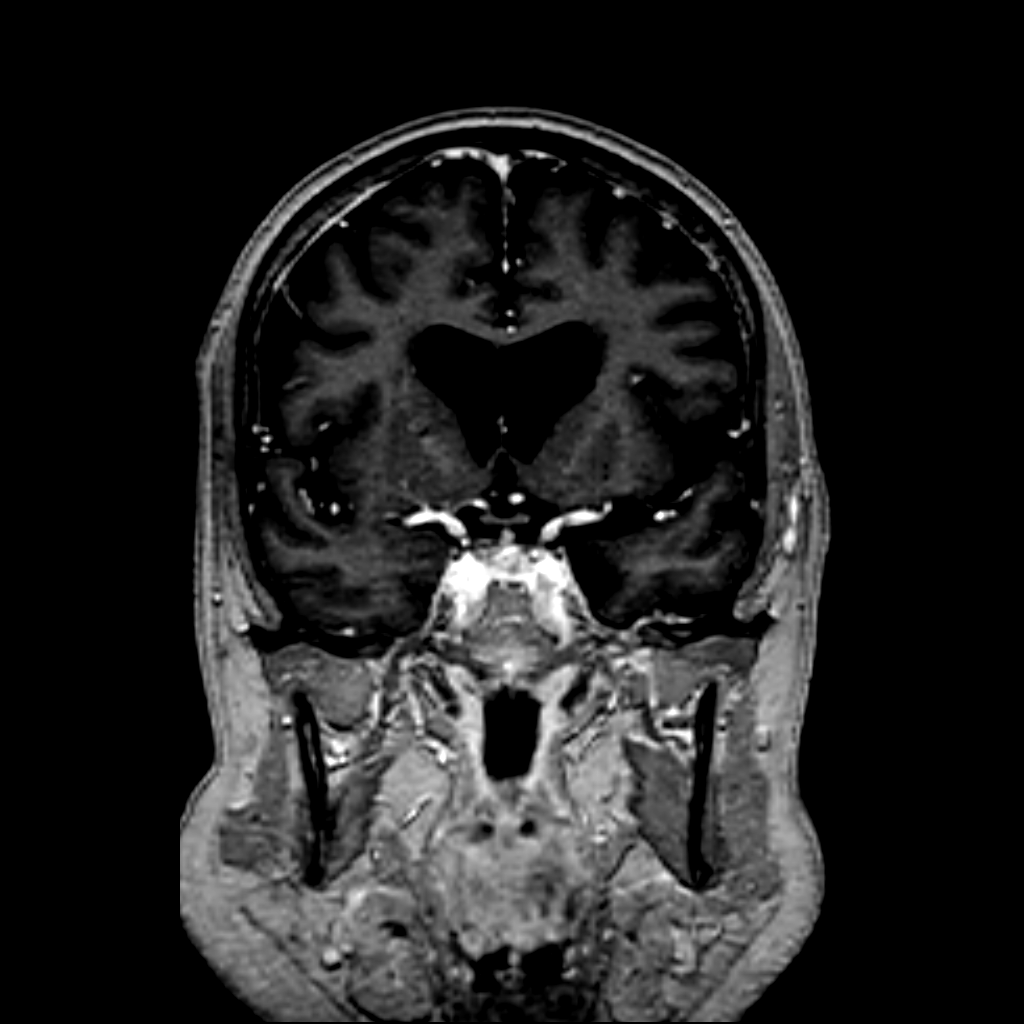
[im 153/179]
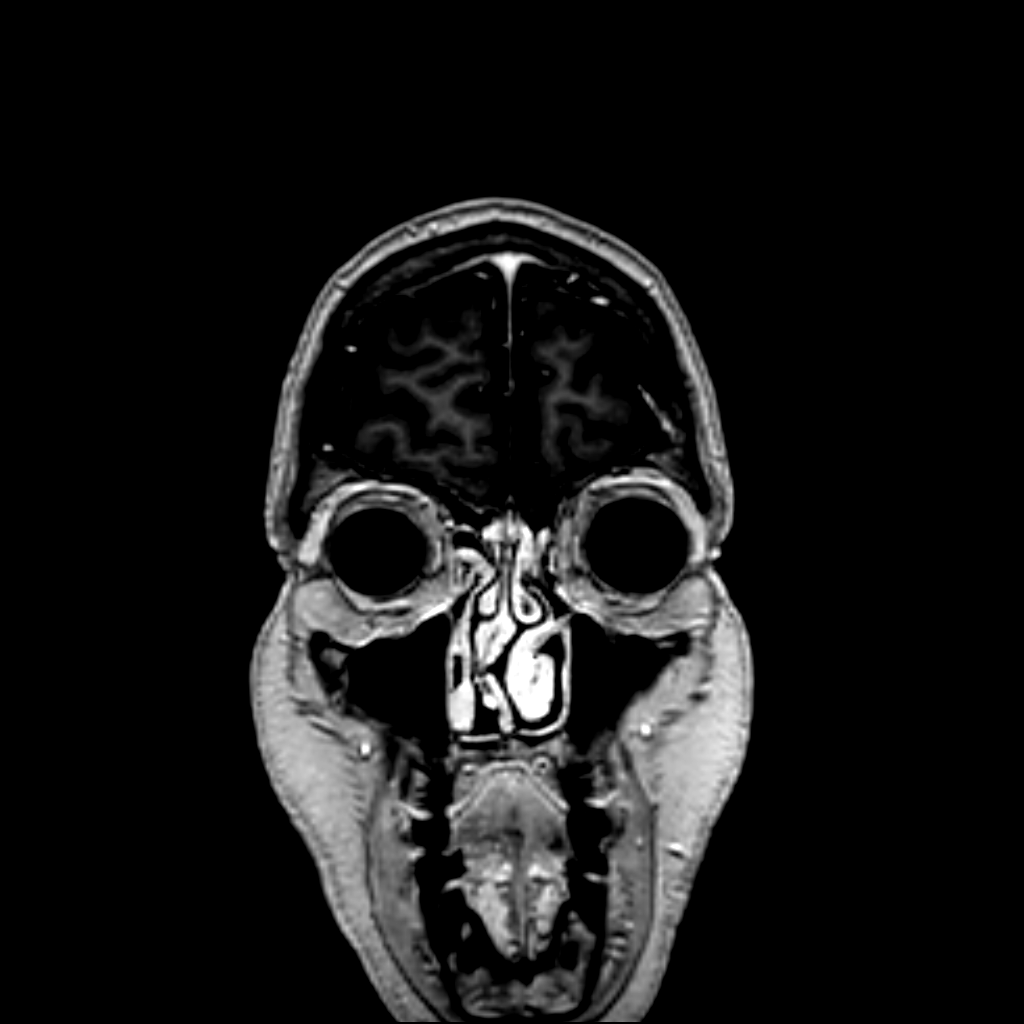

[Series 1003: T1 post-contrast · axial · 1.0mm · 0.24mm/px · z∈[-70,-15]mm · 2 of 170 slices shown (2 of 2)]
[im 29/170]
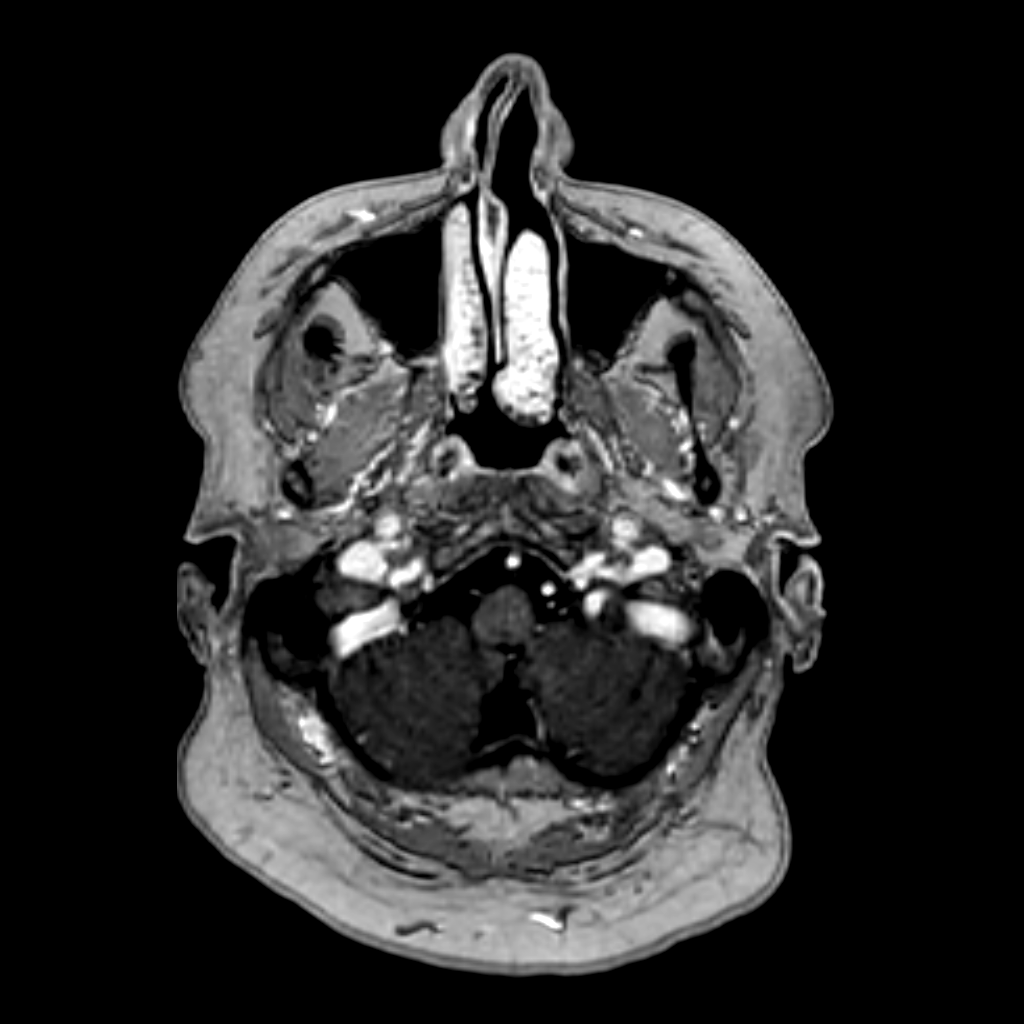
[im 85/170]
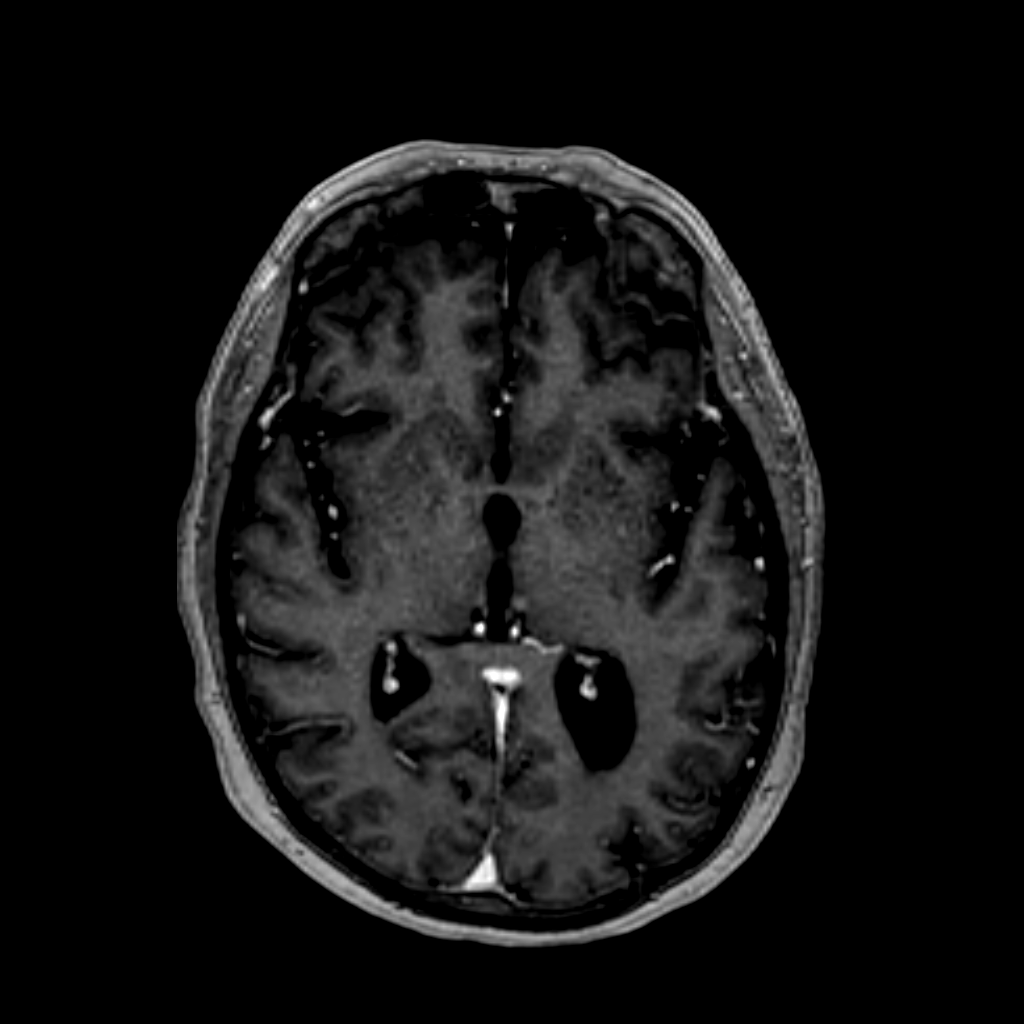

[5 of 48 positions shown; findings below may reference images not displayed]

FINDINGS: -------------------------------------------------------------------------------- 
------------------------- 
INTRACRANIAL: 
No acute ischemia. No abnormal foci of susceptibility artifact in the brain. 
Patency of intracranial vascular flow voids. Periventricular and deep white 
matter change, probably secondary to microangiopathy. This is moderate. No acute 
intracranial hemorrhage, mass effect, midline shift. No pathologic enhancement 
in the brain. There is cerebral volume loss along the frontal lobes and left 
anterior temporal lobe, associated increased size of the frontal horns of the 
lateral ventricles. 
-------------------------------------------------------------------------------- 
----------------------- 
OTHER: 
ORBITS/SINUSES/T-BONES:  Status post bilateral cataract extraction.  Mastoid air 
cells and middle ear cavities are grossly clear.  Mild mucosal changes in the 
paranasal sinuses. 
BONE/SOFT TISSUES: No mass or acute abnormality.  
-------------------------------------------------------------------------------- 
-------------------
IMPRESSION: 1.  No acute intracranial abnormality or pathologic enhancement. 
2.  Moderate white matter microangiopathic changes. 
3.  Cerebral volume loss along the frontal lobes and left anterior temporal 
lobe.
